# Patient Record
Sex: Male | Born: 1935 | Race: White | Hispanic: No | Marital: Married
Health system: Midwestern US, Community
[De-identification: ages and names within clinical notes are randomized; demographics above are authoritative.]

## PROBLEM LIST (undated history)

## (undated) DIAGNOSIS — R011 Cardiac murmur, unspecified: Secondary | ICD-10-CM

## (undated) DIAGNOSIS — E785 Hyperlipidemia, unspecified: Secondary | ICD-10-CM

## (undated) DIAGNOSIS — Z87898 Personal history of other specified conditions: Secondary | ICD-10-CM

## (undated) DIAGNOSIS — C61 Malignant neoplasm of prostate: Secondary | ICD-10-CM

## (undated) DIAGNOSIS — Z8659 Personal history of other mental and behavioral disorders: Secondary | ICD-10-CM

## (undated) DIAGNOSIS — G459 Transient cerebral ischemic attack, unspecified: Secondary | ICD-10-CM

## (undated) DIAGNOSIS — I639 Cerebral infarction, unspecified: Secondary | ICD-10-CM

## (undated) HISTORY — DX: Personal history of other specified conditions: Z87.898

## (undated) HISTORY — DX: Cerebral infarction, unspecified: I63.9

## (undated) HISTORY — PX: HERNIA REPAIR: SHX51

## (undated) HISTORY — DX: Transient cerebral ischemic attack, unspecified: G45.9

## (undated) HISTORY — DX: Malignant neoplasm of prostate: C61

## (undated) HISTORY — DX: Hyperlipidemia, unspecified: E78.5

## (undated) HISTORY — PX: PROSTATE BIOPSY: SHX241

---

## 1999-11-28 ENCOUNTER — Other Ambulatory Visit: Admission: RE | Admit: 1999-11-28 | Discharge: 1999-11-28 | Payer: Self-pay | Admitting: Gastroenterology

## 1999-12-12 ENCOUNTER — Encounter: Payer: Self-pay | Admitting: Gastroenterology

## 1999-12-12 ENCOUNTER — Ambulatory Visit (HOSPITAL_COMMUNITY): Admission: RE | Admit: 1999-12-12 | Discharge: 1999-12-12 | Payer: Self-pay | Admitting: Gastroenterology

## 2014-01-26 ENCOUNTER — Other Ambulatory Visit: Payer: Self-pay | Admitting: Urology

## 2014-01-26 DIAGNOSIS — C61 Malignant neoplasm of prostate: Secondary | ICD-10-CM

## 2014-01-29 ENCOUNTER — Other Ambulatory Visit: Payer: Self-pay | Admitting: Dermatology

## 2014-02-09 ENCOUNTER — Encounter (HOSPITAL_COMMUNITY)
Admission: RE | Admit: 2014-02-09 | Discharge: 2014-02-09 | Disposition: A | Discharge status: Home / Self Care | Payer: Commercial Managed Care - HMO | Source: Ambulatory Visit | Attending: Urology | Admitting: Urology

## 2014-02-09 DIAGNOSIS — C61 Malignant neoplasm of prostate: Secondary | ICD-10-CM | POA: Insufficient documentation

## 2014-02-09 MED ORDER — TECHNETIUM TC 99M MEDRONATE IV KIT
26.0000 | PACK | Freq: Once | INTRAVENOUS | Status: AC | PRN
  Administered 2014-02-09: 26 via INTRAVENOUS

## 2014-03-11 ENCOUNTER — Encounter: Payer: Self-pay | Admitting: Radiation Oncology

## 2014-03-11 NOTE — Progress Notes (Addendum)
GU Location of Tumor / Histology: Adenocarcinoma of the Prostate    If Prostate Cancer, Gleason Score is (4 + 4) and PSA is (15.1)  [facility] Heslin Presentation    Past/Anticipated interventions by urology, if any: Dr. Brock Bad -Biopsy of Prostate and suggesting Androgen Deprivation.  Past/Anticipated interventions by medical oncology, if any: None  Weight changes, if any: Lost ~ 2-3 lbs  Bowel/Bladder complaints, if any: Nocturia x 3-4, and sometimes hourly.  Denies any urgency   Nausea/Vomiting, if any: No  Pain issues, if any: No  SAFETY ISSUES:  Prior radiation? No  Pacemaker/ICD? No  Possible current pregnancy? N/A  Is the patient on methotrexate? No  Current Complaints / other details:

## 2014-03-12 ENCOUNTER — Encounter: Payer: Self-pay | Admitting: Radiation Oncology

## 2014-03-12 ENCOUNTER — Ambulatory Visit
Admission: RE | Admit: 2014-03-12 | Discharge: 2014-03-12 | Disposition: A | Discharge status: Home / Self Care | Payer: Medicare HMO | Source: Ambulatory Visit | Attending: Radiation Oncology | Admitting: Radiation Oncology

## 2014-03-12 VITALS — BP 152/74 | HR 67 | Temp 98.0°F | Ht 72.0 in | Wt 154.4 lb

## 2014-03-12 DIAGNOSIS — C61 Malignant neoplasm of prostate: Secondary | ICD-10-CM | POA: Insufficient documentation

## 2014-03-12 HISTORY — DX: Personal history of other mental and behavioral disorders: Z86.59

## 2014-03-12 HISTORY — DX: Cardiac murmur, unspecified: R01.1

## 2014-03-12 HISTORY — DX: Malignant neoplasm of prostate: C61

## 2014-03-12 NOTE — Progress Notes (Signed)
[facility] NEW PATIENT EVALUATION  Name: Jon Hughes MRN: [id number]  Date:   03/12/2014           DOB: 06/25/1935  Status: outpatient   CC: Dr. 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 male who is seen today through the courtesy of Dr. Diona Fanti for evaluation of his stage T2a high-risk adenocarcinoma prostate.  He tells me that he had multiple biopsies in the past which were all benign.  He had biopsies at the [place] and also in [facility] in 2012.  His PSA in 2011 was approximately 7-8.  His PSA a few years ago rose to 14, and most recently 15.1.  He was seen by Dr. Diona Fanti who confirmed the presence of a nodule along the right lateral base. He performed ultrasound-guided biopsies on 01/19/2014.  He was found to have Gleason score 8 (4+4) involving 70% of one core from the right lateral base and 90% of one core from right lateral mid gland.  Gland volume was approximately 32 mL.  His staging workup including a CT scan and bone scan were without evidence for metastatic disease.  He was found to have an indeterminate low-attenuation structure within the right lobe of the liver and a follow-up exam in 6 months was recommended.  He is doing reasonably well from a GU and GI standpoint.  His I PSS score is 7.   He does have some degree of erectile dysfunction and uses either Cialis or Viagra.    PREVIOUS RADIATION THERAPY: No   PAST MEDICAL HISTORY:  has a past medical history of Prostate cancer; anxiety disorder; major depression; and Cardiac murmur.     PAST SURGICAL HISTORY:  Past Surgical History  Procedure Laterality Date  . Prostate biopsy    . Hernia repair       FAMILY HISTORY: family history includes Breast cancer in his sister; Liver cancer in his mother; Multiple myeloma in  his sister; Throat cancer in his brother. His father died of suicide at 50.  His mother died from " liver cancer" at 35.  No family history of prostate cancer.   SOCIAL HISTORY:  reports that he has quit smoking. His smoking use included Cigarettes. He has a 8 pack-year smoking history. He does not have any smokeless tobacco history on file. He reports that he does not drink alcohol or use illicit drugs. Married, 2 sons from his first marriage.  Graduate of [employer].  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   ALLERGIES: Codeine and Sulfa antibiotics   MEDICATIONS:  Current Outpatient Prescriptions  Medication Sig Dispense Refill  . Multiple Vitamin (MULTIVITAMINS PO) Take 1 tablet by mouth.    Marland Kitchen PARoxetine (PAXIL) 20 MG tablet Take 20 mg by mouth daily.     No current facility-administered medications for this encounter.     REVIEW OF SYSTEMS:  Pertinent items are noted in HPI.    PHYSICAL EXAM:  height is 6' (1.829 m) and weight is 154 lb 6.4 oz (70.035 kg). His temperature is 98 F (36.7 C). His blood pressure is 152/74 and his pulse is 67.   Alert and oriented.  Head and neck examination: Grossly unremarkable.  Nodes: Without palpable cervical or supraclavicular lymphadenopathy.  Abdomen: Without hepatomegaly.  Rectal: Prostate gland is normal in size and there is a firm 1 cm nodule along the lateral right base.  No palpable periprostatic tumor extension.  Extremities: Without edema.   LABORATORY DATA:  No results found for: WBC, HGB, HCT, MCV, PLT No results found for: NA, K, CL, CO2 No results found for: ALT, AST, GGT, ALKPHOS, BILITOT    PSA 15.1   IMPRESSION: StageT2a high-risk adenocarcinoma prostate.  I explained to the patient that his prognosis related to his stage, PSA level, and Gleason score.  His stage is favorable while his PSA of 15.1 is of intermediate favorability, and his Gleason score of 8 is distinctly unfavorable.  He has  high-risk disease.  Dr. Diona Fanti explained to him that he has a 25% chance for lymph node involvement, 15% chance for seminal vesicle involvement and 75% chance for extracapsular extension.  Fortunately, his staging workup is negative.  We discussed management options, and in particular radiation therapy.  Radiation therapy options include 5 weeks of external beam followed by seed implantation or 8 weeks of external beam/IMRT.  I do favor external beam/IMRT to avoid the OR, because of the significant risk for extracapsular extension.  We discussed the potential acute and late toxicities of radiation therapy.  We also discussed bladder filling to minimize urinary toxicity.  We discussed the fact that we need 3 gold seed markers for image guidance.  We discussed androgen deprivation therapy which ideally should be given for 2 years.  We discussed the potential side effects of androgen deprivation therapy including also sex drive/erectile dysfunction, hot flashes, and fatigue.  After lengthy discussion he is most interested in external beam/IMRT along with androgen deprivation therapy which I think would be an excellent choice for him.  I will speak with Dr. Alan Ripper nurse to get him started on his androgen deprivation therapy.  We will get him scheduled for placement of gold seeds within the next one to 2 months.  Follow visit with me in 2 months.  PLAN: As discussed above.   I spent 60 minutes minutes face to face with the patient and more than 50% of that time was spent in counseling and/or coordination of care.

## 2014-03-13 ENCOUNTER — Telehealth: Payer: Self-pay | Admitting: *Deleted

## 2014-03-13 NOTE — Telephone Encounter (Signed)
Called patient to inform of [facility]  Appt. For 05/14/14, lvm for a return call

## 2014-03-13 NOTE — Telephone Encounter (Signed)
Called patient to inform of [facility] appt. For 5/

## 2014-03-16 ENCOUNTER — Encounter: Payer: Self-pay | Admitting: Radiation Oncology

## 2014-03-16 NOTE — Progress Notes (Signed)
CC: Dr. Franchot Gallo, Dr. Domenick Gong   I scheduled Jon Hughes for initiation of androgen deprivation therapy through Dr. Alan Ripper nurse as requested by the patient.  At the end of his consultation with me, he agreed to androgen deprivation therapy along with external beam/IMRT.  I am somewhat surprised that he now tells my staff that he really had not made up his mind.  My understanding is that he has been speaking with his brother and is "looking at other options" including ?cryotherapy at [facility].  Therefore, we will not move ahead with androgen deprivation therapy or placement of gold seeds. We will cancel his follow-up with me in 2 months.  He can contact me if he wants to review his options once again.  I more than happy to see him in the future.

## 2014-03-18 NOTE — Addendum Note (Signed)
Encounter addended by: Benn Moulder, RN on: 03/18/2014  1:44 PM<BR>     Documentation filed: Arn Medal VN

## 2014-05-05 ENCOUNTER — Other Ambulatory Visit: Payer: Self-pay | Admitting: Dermatology

## 2014-05-14 ENCOUNTER — Ambulatory Visit: Payer: Medicare HMO | Admitting: Radiation Oncology

## 2014-05-14 ENCOUNTER — Ambulatory Visit: Payer: Medicare HMO

## 2014-06-30 ENCOUNTER — Telehealth: Payer: Self-pay | Admitting: *Deleted

## 2014-06-30 NOTE — Telephone Encounter (Signed)
On 06-29-14 fax medical records to to justin cockerham at [facility] it was consult note.

## 2014-07-06 ENCOUNTER — Other Ambulatory Visit: Payer: Self-pay

## 2014-07-20 DIAGNOSIS — H2513 Age-related nuclear cataract, bilateral: Secondary | ICD-10-CM | POA: Diagnosis not present

## 2014-07-20 DIAGNOSIS — H25013 Cortical age-related cataract, bilateral: Secondary | ICD-10-CM | POA: Diagnosis not present

## 2014-07-20 DIAGNOSIS — H25012 Cortical age-related cataract, left eye: Secondary | ICD-10-CM | POA: Diagnosis not present

## 2014-07-20 DIAGNOSIS — H2512 Age-related nuclear cataract, left eye: Secondary | ICD-10-CM | POA: Diagnosis not present

## 2014-07-27 DIAGNOSIS — R3912 Poor urinary stream: Secondary | ICD-10-CM | POA: Diagnosis not present

## 2014-07-27 DIAGNOSIS — N402 Nodular prostate without lower urinary tract symptoms: Secondary | ICD-10-CM | POA: Diagnosis not present

## 2014-07-27 DIAGNOSIS — C61 Malignant neoplasm of prostate: Secondary | ICD-10-CM | POA: Diagnosis not present

## 2014-07-27 DIAGNOSIS — N320 Bladder-neck obstruction: Secondary | ICD-10-CM | POA: Diagnosis not present

## 2014-07-28 DIAGNOSIS — C61 Malignant neoplasm of prostate: Secondary | ICD-10-CM | POA: Diagnosis not present

## 2014-07-29 DIAGNOSIS — Z510 Encounter for antineoplastic radiation therapy: Secondary | ICD-10-CM | POA: Diagnosis not present

## 2014-07-29 DIAGNOSIS — C61 Malignant neoplasm of prostate: Secondary | ICD-10-CM | POA: Diagnosis not present

## 2014-07-30 DIAGNOSIS — C61 Malignant neoplasm of prostate: Secondary | ICD-10-CM | POA: Diagnosis not present

## 2014-08-24 DIAGNOSIS — C61 Malignant neoplasm of prostate: Secondary | ICD-10-CM | POA: Diagnosis not present

## 2014-08-25 DIAGNOSIS — Z510 Encounter for antineoplastic radiation therapy: Secondary | ICD-10-CM | POA: Diagnosis not present

## 2014-08-25 DIAGNOSIS — C61 Malignant neoplasm of prostate: Secondary | ICD-10-CM | POA: Diagnosis not present

## 2014-08-26 DIAGNOSIS — C61 Malignant neoplasm of prostate: Secondary | ICD-10-CM | POA: Diagnosis not present

## 2014-08-26 DIAGNOSIS — Z510 Encounter for antineoplastic radiation therapy: Secondary | ICD-10-CM | POA: Diagnosis not present

## 2014-08-27 DIAGNOSIS — C61 Malignant neoplasm of prostate: Secondary | ICD-10-CM | POA: Diagnosis not present

## 2014-08-27 DIAGNOSIS — Z510 Encounter for antineoplastic radiation therapy: Secondary | ICD-10-CM | POA: Diagnosis not present

## 2014-08-28 DIAGNOSIS — C61 Malignant neoplasm of prostate: Secondary | ICD-10-CM | POA: Diagnosis not present

## 2014-08-28 DIAGNOSIS — Z510 Encounter for antineoplastic radiation therapy: Secondary | ICD-10-CM | POA: Diagnosis not present

## 2014-08-31 DIAGNOSIS — C61 Malignant neoplasm of prostate: Secondary | ICD-10-CM | POA: Diagnosis not present

## 2014-08-31 DIAGNOSIS — Z510 Encounter for antineoplastic radiation therapy: Secondary | ICD-10-CM | POA: Diagnosis not present

## 2014-09-01 DIAGNOSIS — Z510 Encounter for antineoplastic radiation therapy: Secondary | ICD-10-CM | POA: Diagnosis not present

## 2014-09-01 DIAGNOSIS — C61 Malignant neoplasm of prostate: Secondary | ICD-10-CM | POA: Diagnosis not present

## 2014-09-02 DIAGNOSIS — C61 Malignant neoplasm of prostate: Secondary | ICD-10-CM | POA: Diagnosis not present

## 2014-09-02 DIAGNOSIS — Z510 Encounter for antineoplastic radiation therapy: Secondary | ICD-10-CM | POA: Diagnosis not present

## 2014-09-03 DIAGNOSIS — Z510 Encounter for antineoplastic radiation therapy: Secondary | ICD-10-CM | POA: Diagnosis not present

## 2014-09-03 DIAGNOSIS — C61 Malignant neoplasm of prostate: Secondary | ICD-10-CM | POA: Diagnosis not present

## 2014-09-04 DIAGNOSIS — C61 Malignant neoplasm of prostate: Secondary | ICD-10-CM | POA: Diagnosis not present

## 2014-09-04 DIAGNOSIS — Z510 Encounter for antineoplastic radiation therapy: Secondary | ICD-10-CM | POA: Diagnosis not present

## 2014-09-07 DIAGNOSIS — C61 Malignant neoplasm of prostate: Secondary | ICD-10-CM | POA: Diagnosis not present

## 2014-09-07 DIAGNOSIS — Z510 Encounter for antineoplastic radiation therapy: Secondary | ICD-10-CM | POA: Diagnosis not present

## 2014-09-08 DIAGNOSIS — Z510 Encounter for antineoplastic radiation therapy: Secondary | ICD-10-CM | POA: Diagnosis not present

## 2014-09-08 DIAGNOSIS — C61 Malignant neoplasm of prostate: Secondary | ICD-10-CM | POA: Diagnosis not present

## 2014-09-09 DIAGNOSIS — Z510 Encounter for antineoplastic radiation therapy: Secondary | ICD-10-CM | POA: Diagnosis not present

## 2014-09-09 DIAGNOSIS — C61 Malignant neoplasm of prostate: Secondary | ICD-10-CM | POA: Diagnosis not present

## 2014-09-10 DIAGNOSIS — Z510 Encounter for antineoplastic radiation therapy: Secondary | ICD-10-CM | POA: Diagnosis not present

## 2014-09-10 DIAGNOSIS — C61 Malignant neoplasm of prostate: Secondary | ICD-10-CM | POA: Diagnosis not present

## 2014-09-11 DIAGNOSIS — Z510 Encounter for antineoplastic radiation therapy: Secondary | ICD-10-CM | POA: Diagnosis not present

## 2014-09-11 DIAGNOSIS — C61 Malignant neoplasm of prostate: Secondary | ICD-10-CM | POA: Diagnosis not present

## 2014-09-15 DIAGNOSIS — C61 Malignant neoplasm of prostate: Secondary | ICD-10-CM | POA: Diagnosis not present

## 2014-09-15 DIAGNOSIS — Z510 Encounter for antineoplastic radiation therapy: Secondary | ICD-10-CM | POA: Diagnosis not present

## 2014-09-16 DIAGNOSIS — Z510 Encounter for antineoplastic radiation therapy: Secondary | ICD-10-CM | POA: Diagnosis not present

## 2014-09-16 DIAGNOSIS — C61 Malignant neoplasm of prostate: Secondary | ICD-10-CM | POA: Diagnosis not present

## 2014-09-17 DIAGNOSIS — Z510 Encounter for antineoplastic radiation therapy: Secondary | ICD-10-CM | POA: Diagnosis not present

## 2014-09-17 DIAGNOSIS — C61 Malignant neoplasm of prostate: Secondary | ICD-10-CM | POA: Diagnosis not present

## 2014-09-18 DIAGNOSIS — Z510 Encounter for antineoplastic radiation therapy: Secondary | ICD-10-CM | POA: Diagnosis not present

## 2014-09-18 DIAGNOSIS — C61 Malignant neoplasm of prostate: Secondary | ICD-10-CM | POA: Diagnosis not present

## 2014-09-21 DIAGNOSIS — Z510 Encounter for antineoplastic radiation therapy: Secondary | ICD-10-CM | POA: Diagnosis not present

## 2014-09-21 DIAGNOSIS — C61 Malignant neoplasm of prostate: Secondary | ICD-10-CM | POA: Diagnosis not present

## 2014-09-22 DIAGNOSIS — Z510 Encounter for antineoplastic radiation therapy: Secondary | ICD-10-CM | POA: Diagnosis not present

## 2014-09-22 DIAGNOSIS — C61 Malignant neoplasm of prostate: Secondary | ICD-10-CM | POA: Diagnosis not present

## 2014-09-23 DIAGNOSIS — Z510 Encounter for antineoplastic radiation therapy: Secondary | ICD-10-CM | POA: Diagnosis not present

## 2014-09-23 DIAGNOSIS — C61 Malignant neoplasm of prostate: Secondary | ICD-10-CM | POA: Diagnosis not present

## 2014-09-24 DIAGNOSIS — C61 Malignant neoplasm of prostate: Secondary | ICD-10-CM | POA: Diagnosis not present

## 2014-09-24 DIAGNOSIS — Z510 Encounter for antineoplastic radiation therapy: Secondary | ICD-10-CM | POA: Diagnosis not present

## 2014-09-25 DIAGNOSIS — C61 Malignant neoplasm of prostate: Secondary | ICD-10-CM | POA: Diagnosis not present

## 2014-09-25 DIAGNOSIS — Z510 Encounter for antineoplastic radiation therapy: Secondary | ICD-10-CM | POA: Diagnosis not present

## 2014-09-28 DIAGNOSIS — C61 Malignant neoplasm of prostate: Secondary | ICD-10-CM | POA: Diagnosis not present

## 2014-09-28 DIAGNOSIS — Z510 Encounter for antineoplastic radiation therapy: Secondary | ICD-10-CM | POA: Diagnosis not present

## 2014-09-29 DIAGNOSIS — C61 Malignant neoplasm of prostate: Secondary | ICD-10-CM | POA: Diagnosis not present

## 2014-09-29 DIAGNOSIS — Z510 Encounter for antineoplastic radiation therapy: Secondary | ICD-10-CM | POA: Diagnosis not present

## 2014-09-30 DIAGNOSIS — Z510 Encounter for antineoplastic radiation therapy: Secondary | ICD-10-CM | POA: Diagnosis not present

## 2014-09-30 DIAGNOSIS — C61 Malignant neoplasm of prostate: Secondary | ICD-10-CM | POA: Diagnosis not present

## 2014-10-01 DIAGNOSIS — Z510 Encounter for antineoplastic radiation therapy: Secondary | ICD-10-CM | POA: Diagnosis not present

## 2014-10-01 DIAGNOSIS — C61 Malignant neoplasm of prostate: Secondary | ICD-10-CM | POA: Diagnosis not present

## 2014-10-02 DIAGNOSIS — C61 Malignant neoplasm of prostate: Secondary | ICD-10-CM | POA: Diagnosis not present

## 2014-10-02 DIAGNOSIS — Z510 Encounter for antineoplastic radiation therapy: Secondary | ICD-10-CM | POA: Diagnosis not present

## 2014-10-05 DIAGNOSIS — Z510 Encounter for antineoplastic radiation therapy: Secondary | ICD-10-CM | POA: Diagnosis not present

## 2014-10-05 DIAGNOSIS — C61 Malignant neoplasm of prostate: Secondary | ICD-10-CM | POA: Diagnosis not present

## 2014-10-06 DIAGNOSIS — C61 Malignant neoplasm of prostate: Secondary | ICD-10-CM | POA: Diagnosis not present

## 2014-10-06 DIAGNOSIS — Z510 Encounter for antineoplastic radiation therapy: Secondary | ICD-10-CM | POA: Diagnosis not present

## 2014-10-07 DIAGNOSIS — Z510 Encounter for antineoplastic radiation therapy: Secondary | ICD-10-CM | POA: Diagnosis not present

## 2014-10-07 DIAGNOSIS — C61 Malignant neoplasm of prostate: Secondary | ICD-10-CM | POA: Diagnosis not present

## 2014-10-08 DIAGNOSIS — Z510 Encounter for antineoplastic radiation therapy: Secondary | ICD-10-CM | POA: Diagnosis not present

## 2014-10-08 DIAGNOSIS — C61 Malignant neoplasm of prostate: Secondary | ICD-10-CM | POA: Diagnosis not present

## 2014-10-09 DIAGNOSIS — C61 Malignant neoplasm of prostate: Secondary | ICD-10-CM | POA: Diagnosis not present

## 2014-10-09 DIAGNOSIS — Z510 Encounter for antineoplastic radiation therapy: Secondary | ICD-10-CM | POA: Diagnosis not present

## 2014-10-12 DIAGNOSIS — Z510 Encounter for antineoplastic radiation therapy: Secondary | ICD-10-CM | POA: Diagnosis not present

## 2014-10-12 DIAGNOSIS — C61 Malignant neoplasm of prostate: Secondary | ICD-10-CM | POA: Diagnosis not present

## 2014-10-13 DIAGNOSIS — Z510 Encounter for antineoplastic radiation therapy: Secondary | ICD-10-CM | POA: Diagnosis not present

## 2014-10-13 DIAGNOSIS — C61 Malignant neoplasm of prostate: Secondary | ICD-10-CM | POA: Diagnosis not present

## 2014-10-16 DIAGNOSIS — H9211 Otorrhea, right ear: Secondary | ICD-10-CM | POA: Diagnosis not present

## 2014-10-16 DIAGNOSIS — H7291 Unspecified perforation of tympanic membrane, right ear: Secondary | ICD-10-CM | POA: Diagnosis not present

## 2014-10-16 DIAGNOSIS — H9011 Conductive hearing loss, unilateral, right ear, with unrestricted hearing on the contralateral side: Secondary | ICD-10-CM | POA: Diagnosis not present

## 2014-10-19 DIAGNOSIS — F331 Major depressive disorder, recurrent, moderate: Secondary | ICD-10-CM | POA: Diagnosis not present

## 2014-11-03 DIAGNOSIS — H900 Conductive hearing loss, bilateral: Secondary | ICD-10-CM | POA: Diagnosis not present

## 2014-11-20 DIAGNOSIS — L820 Inflamed seborrheic keratosis: Secondary | ICD-10-CM | POA: Diagnosis not present

## 2014-11-20 DIAGNOSIS — L821 Other seborrheic keratosis: Secondary | ICD-10-CM | POA: Diagnosis not present

## 2014-11-20 DIAGNOSIS — D1801 Hemangioma of skin and subcutaneous tissue: Secondary | ICD-10-CM | POA: Diagnosis not present

## 2014-11-20 DIAGNOSIS — Z85828 Personal history of other malignant neoplasm of skin: Secondary | ICD-10-CM | POA: Diagnosis not present

## 2014-11-20 DIAGNOSIS — L218 Other seborrheic dermatitis: Secondary | ICD-10-CM | POA: Diagnosis not present

## 2014-11-20 DIAGNOSIS — L570 Actinic keratosis: Secondary | ICD-10-CM | POA: Diagnosis not present

## 2015-02-02 ENCOUNTER — Telehealth: Payer: Self-pay

## 2015-02-02 NOTE — Telephone Encounter (Signed)
Family request a courtesy call back from Dr. Joseph Art.  [phone]

## 2015-02-08 NOTE — Telephone Encounter (Signed)
What is this about?   I don't believe this man is our patient.

## 2015-02-09 NOTE — Telephone Encounter (Signed)
Delman Cheadle or Reginia Forts or Janeann Forehand.

## 2015-02-09 NOTE — Telephone Encounter (Signed)
Dr. Carlean Jews  I spoke with the Pt. He has not seen you,  him and his wife know you from and I will quote him the temple. He says his wife Jenny Reichmann knows your wife, and he was looking to establish care with you. I expressed to him that at this time you were not accepting new patients and he said he understood and he wanted your opinion on who he should establish care with.

## 2015-02-09 NOTE — Telephone Encounter (Signed)
Pt.notified

## 2015-06-28 DIAGNOSIS — F331 Major depressive disorder, recurrent, moderate: Secondary | ICD-10-CM | POA: Diagnosis not present

## 2015-08-25 DIAGNOSIS — H43812 Vitreous degeneration, left eye: Secondary | ICD-10-CM | POA: Diagnosis not present

## 2015-08-25 DIAGNOSIS — H2513 Age-related nuclear cataract, bilateral: Secondary | ICD-10-CM | POA: Diagnosis not present

## 2015-08-25 DIAGNOSIS — H35372 Puckering of macula, left eye: Secondary | ICD-10-CM | POA: Diagnosis not present

## 2015-08-25 DIAGNOSIS — H25013 Cortical age-related cataract, bilateral: Secondary | ICD-10-CM | POA: Diagnosis not present

## 2015-08-30 DIAGNOSIS — H2513 Age-related nuclear cataract, bilateral: Secondary | ICD-10-CM | POA: Diagnosis not present

## 2015-08-30 DIAGNOSIS — H43812 Vitreous degeneration, left eye: Secondary | ICD-10-CM | POA: Diagnosis not present

## 2015-08-30 DIAGNOSIS — H2512 Age-related nuclear cataract, left eye: Secondary | ICD-10-CM | POA: Diagnosis not present

## 2015-08-30 DIAGNOSIS — H2511 Age-related nuclear cataract, right eye: Secondary | ICD-10-CM | POA: Diagnosis not present

## 2015-08-30 NOTE — Op Note (Addendum)
[facility]    [place], [place]                 Report Type: DAY SURGERY PROCEDURE             Report Status: Signed        ___________________________________________________________________                                          PATIENT NAMCecilie Kicks: Brandt,[name] PATIENT ID# [id number]    BIRTHDATE:     [birth year]    ACCOUNT # [id number]    AGE:     81    REG DATE:  08/30/15    GENDER:     [name]    ADM DATE:      LOC:     DS    DISCH:         08/30/15    ___________________________________________________________________    Lysle DingwallINTERESTED PARTIES: NURSING UNIT:  DS          ATTENDING PHYSICIAN: Brand MalesWHITAKER,Taliana Mersereau D MD            ORDERING PHYSICIAN:               ADDITIONAL COPIES:       ___________________________________________________________________    DictatorBrand Males:   Jagjit Riner D MD        PREOPERATIVE DIAGNOSIS:     Senile cataract, left eye.          POSTOPERATIVE DIAGNOSIS:     Senile cataract, left eye.          PROCEDURE:     Phacoemulsification with posterior chamber intraocular lens, left eye.          ANESTHESIA:     Topical MAC.          BLOOD LOSS:     None.          INDICATIONS:     The patient is a 80 year old male with gradually worsening vision consistent with      senile cataract. The risks, benefits, and alternatives of cataract extraction were      discussed in detail including loss of vision, failing its intended effect, need for a      reoperation, a distorted pupil, macular edema and endophthalmitis. Questions were      answered. The patient understood and wished to proceed.          DESCRIPTION OF PROCEDURE:     The patient was escorted to the operating suite whereupon the operative left eye was      confirmed prior to induction of the sedation by the anesthesiologist. The patient      was prepped and draped in the usual sterile ophthalmic fashion. The operating      microscope was positioned over the left eye and a speculum introduced. The       paracentesis port was made and a clear corneal incision made with a keratome. The      anterior chamber was stabilized with viscoelastic and a continuous curvilinear      capsulorrhexis was started with a cystitome and completed with Utrata forceps.      Adequate hydrodissection was achieved with preservative free lidocaine. The nucleus      was chopped in the horizontal meridian with the chopper in the surgeon's nondominant      hand, the nucleus which aspirated in the iris  plan with the phacoemulsification hand      piece. Remaining cortical material was stripped with the I and A cannula, and the      posterior capsule polished as needed. The capsular bag was reformed with      viscoelastic and a 19.5 diopter SN60WF lens, serial #[id number], placed in the      capsular bag with the Monarch injector. Viscoelastic was removed with the I and A      cannula. The wound was hydrated and the anterior chamber reformed with balanced salt      solution. The pupil was round and there was no wound leak at the end of the case.                The CDE was 1.15.           The patient was discharged to recovery in good condition.               DD: 08/30/2015 11:26:59     DT: 08/30/2015 11:48:43     JOB#: [id number]     MDW/MODL              Electronically Signed by:  Garth SchlatterWHITAKER,Eustace Hur D MD at  10/11/15 1152        Electronically Co-Signed by:  at          Disclaimer: Portions of this document may contain text elements generated through computerized   voice recognition. Undetected computer generated word errors are possible. Please notify the   signing provider or return the document to [facility] department if clarification or   correction is needed.

## 2015-08-30 NOTE — Op Note (Addendum)
[facility]    [place], [place]                 Report Type: DAY SURGERY PROCEDURE             Report Status: Signed        ___________________________________________________________________                                          PATIENT NAMCecilie Kicks: Marcell,Henrik P PATIENT ID# [id number]    BIRTHDATE:     Feb 19, 1935    ACCOUNT # [id number]    AGE:     81    REG DATE:  08/30/15    GENDER:     [name]    ADM DATE:      LOC:     DS    DISCH:         08/30/15    ___________________________________________________________________    Lysle DingwallINTERESTED PARTIES: NURSING UNIT:  DS          ATTENDING PHYSICIAN: Brand MalesWHITAKER,Diago Haik D MD            ORDERING PHYSICIAN:               ADDITIONAL COPIES:       ___________________________________________________________________    DictatorBrand Males:   Darshana Curnutt D MD        PREOPERATIVE DIAGNOSIS:    Senile cataract, right eye.        POSTOPERATIVE DIAGNOSIS:    Senile cataract, right eye.        PROCEDURE:    Phacoemulsification with posterior chamber intraocular lens, right eye.         ANESTHESIA:    Topical MAC.        BLOOD LOSS:    None.        INDICATIONS:    The patient is a 80 year old male with gradually worsening vision consistent with     senile cataract. The risks, benefits, and alternatives of cataract extraction were     discussed in detail including loss of vision, failing its intended effect, need for a     reoperation, a distorted pupil, macular edema and endophthalmitis. Questions were     answered. The patient understood and wished to proceed.        DESCRIPTION OF PROCEDURE:    The patient was escorted to the operating suite whereupon the operative right eye was     confirmed prior to induction of the sedation by the anesthesiologist. The patient     was prepped and draped in the usual sterile ophthalmic fashion. The operating     microscope was positioned over the right eye and a speculum introduced. The      paracentesis port was made and a clear corneal incision made with a keratome. The     anterior chamber was stabilized with viscoelastic and a continuous curvilinear     capsulorrhexis was started with a cystitome and completed with Utrata forceps.     Adequate hydrodissection was achieved with preservative free lidocaine. The nucleus     was chopped in the horizontal meridian with the chopper in the     surgeon's nondominant hand, the nucleus which aspirated in the iris plan with the     phacoemulsification hand piece. Remaining cortical material was stripped with the I     and A cannula, and the posterior capsule polished as  needed. The capsular bag was     reformed with viscoelastic and a 18.5 diopter with 3 diopters add Alcon [id number] ReSTOR multifocal   lens, serial     #[id number], placed in the capsular bag with the     Monarch injector. Viscoelastic was removed with the I and A cannula. The wound was     hydrated and the anterior chamber reformed with balanced salt solution. The pupil     was round and there was no wound leak at the end of the case.         The CDE was 1.28.         The patient was discharged to recovery in good condition.            DD: 08/30/2015 11:28:03    DT: 08/30/2015 11:52:04    JOB#: [id number]    MDW/MODL            Electronically Signed by:  Garth SchlatterWHITAKER,Tesslyn Baumert D MD at  10/11/15 1225        Electronically Co-Signed by:  at          Disclaimer: Portions of this document may contain text elements generated through computerized   voice recognition. Undetected computer generated word errors are possible. Please notify the   signing provider or return the document to [facility] Records department if clarification or   correction is needed.

## 2015-08-30 NOTE — Op Note (Signed)
[facility]    [place], [place]                 Report Type: DAY SURGERY PROCEDURE             Report Status: Signed        ___________________________________________________________________                                          PATIENT NAMCecilie Brandt: Rampersad,Sanjeev P PATIENT ID# [id number]    BIRTHDATE:     Jul 04, 1935    ACCOUNT # [id number]    AGE:     81    REG DATE:  08/30/15    GENDER:     [name]    ADM DATE:      LOC:     DS    DISCH:         08/30/15    ___________________________________________________________________    Lysle DingwallINTERESTED PARTIES: NURSING UNIT:  DS          ATTENDING PHYSICIAN: Brand MalesWHITAKER,Ajahni Nay D MD            ORDERING PHYSICIAN:               ADDITIONAL COPIES:       ___________________________________________________________________    DictatorBrand Males:   Aarin Sparkman D MD        PREOPERATIVE DIAGNOSIS:    Senile cataract, right eye.        POSTOPERATIVE DIAGNOSIS:    Senile cataract, right eye.        PROCEDURE:    Phacoemulsification with posterior chamber intraocular lens, right eye.         ANESTHESIA:    Topical MAC.        BLOOD LOSS:    None.        INDICATIONS:    The patient is a 80 year old male with gradually worsening vision consistent with     senile cataract. The risks, benefits, and alternatives of cataract extraction were     discussed in detail including loss of vision, failing its intended effect, need for a     reoperation, a distorted pupil, macular edema and endophthalmitis. Questions were     answered. The patient understood and wished to proceed.        DESCRIPTION OF PROCEDURE:    The patient was escorted to the operating suite whereupon the operative right eye was     confirmed prior to induction of the sedation by the anesthesiologist. The patient     was prepped and draped in the usual sterile ophthalmic fashion. The operating     microscope was positioned over the right eye and a speculum introduced. The     paracentesis port was made and a clear corneal  incision made with a keratome. The     anterior chamber was stabilized with viscoelastic and a continuous curvilinear     capsulorrhexis was started with a cystitome and completed with Utrata forceps.     Adequate hydrodissection was achieved with preservative free lidocaine. The nucleus     was chopped in the horizontal meridian with the chopper in the     surgeon's nondominant hand, the nucleus which aspirated in the iris plan with the     phacoemulsification hand piece. Remaining cortical material was stripped with the I     and A cannula, and the posterior capsule polished as  needed. The capsular bag was     reformed with viscoelastic and a 18.5 diopter with 3 diopters add Alcon [id number] ReSTOR multifocal   lens, serial     #[id number], placed in the capsular bag with the     Monarch injector. Viscoelastic was removed with the I and A cannula. The wound was     hydrated and the anterior chamber reformed with balanced salt solution. The pupil     was round and there was no wound leak at the end of the case.         The CDE was 1.28.         The patient was discharged to recovery in good condition.            DD: 08/30/2015 11:28:03    DT: 08/30/2015 11:52:04    JOB#: [id number]    MDW/MODL            Electronically Signed by:  Garth SchlatterWHITAKER,Tesslyn Baumert D MD at  10/11/15 1225        Electronically Co-Signed by:  at          Disclaimer: Portions of this document may contain text elements generated through computerized   voice recognition. Undetected computer generated word errors are possible. Please notify the   signing provider or return the document to [facility] Records department if clarification or   correction is needed.

## 2015-08-30 NOTE — Op Note (Signed)
[facility]    [place], [place]                 Report Type: DAY SURGERY PROCEDURE             Report Status: Signed        ___________________________________________________________________                                          PATIENT NAMCecilie Patrick Brandt,Patrick Brandt PATIENT ID# [id number]    BIRTHDATE:     [birth year]    ACCOUNT # [id number]    AGE:     81    REG DATE:  08/30/15    GENDER:     [name]    ADM DATE:      LOC:     DS    DISCH:         08/30/15    ___________________________________________________________________    Lysle DingwallINTERESTED PARTIES: NURSING UNIT:  DS          ATTENDING PHYSICIAN: Brand MalesWHITAKER,Alex Leahy D MD            ORDERING PHYSICIAN:               ADDITIONAL COPIES:       ___________________________________________________________________    DictatorBrand Males:   Jayshun Galentine D MD        PREOPERATIVE DIAGNOSIS:     Senile cataract, left eye.          POSTOPERATIVE DIAGNOSIS:     Senile cataract, left eye.          PROCEDURE:     Phacoemulsification with posterior chamber intraocular lens, left eye.          ANESTHESIA:     Topical MAC.          BLOOD LOSS:     None.          INDICATIONS:     The patient is a 80 year old male with gradually worsening vision consistent with      senile cataract. The risks, benefits, and alternatives of cataract extraction were      discussed in detail including loss of vision, failing its intended effect, need for a      reoperation, a distorted pupil, macular edema and endophthalmitis. Questions were      answered. The patient understood and wished to proceed.          DESCRIPTION OF PROCEDURE:     The patient was escorted to the operating suite whereupon the operative left eye was      confirmed prior to induction of the sedation by the anesthesiologist. The patient      was prepped and draped in the usual sterile ophthalmic fashion. The operating      microscope was positioned over the left eye and a speculum introduced. The      paracentesis port was made and a  clear corneal incision made with a keratome. The      anterior chamber was stabilized with viscoelastic and a continuous curvilinear      capsulorrhexis was started with a cystitome and completed with Utrata forceps.      Adequate hydrodissection was achieved with preservative free lidocaine. The nucleus      was chopped in the horizontal meridian with the chopper in the surgeon's nondominant      hand, the nucleus which aspirated in the iris  plan with the phacoemulsification hand      piece. Remaining cortical material was stripped with the I and A cannula, and the      posterior capsule polished as needed. The capsular bag was reformed with      viscoelastic and a 19.5 diopter SN60WF lens, serial #[id number], placed in the      capsular bag with the Monarch injector. Viscoelastic was removed with the I and A      cannula. The wound was hydrated and the anterior chamber reformed with balanced salt      solution. The pupil was round and there was no wound leak at the end of the case.                The CDE was 1.15.           The patient was discharged to recovery in good condition.               DD: 08/30/2015 11:26:59     DT: 08/30/2015 11:48:43     JOB#: [id number]     MDW/MODL              Electronically Signed by:  Garth SchlatterWHITAKER,Aramis Zobel D MD at  10/11/15 1152        Electronically Co-Signed by:  at          Disclaimer: Portions of this document may contain text elements generated through computerized   voice recognition. Undetected computer generated word errors are possible. Please notify the   signing provider or return the document to [facility] department if clarification or   correction is needed.

## 2015-11-04 DIAGNOSIS — M542 Cervicalgia: Secondary | ICD-10-CM | POA: Diagnosis not present

## 2015-11-08 DIAGNOSIS — M542 Cervicalgia: Secondary | ICD-10-CM | POA: Diagnosis not present

## 2015-11-10 DIAGNOSIS — M542 Cervicalgia: Secondary | ICD-10-CM | POA: Diagnosis not present

## 2015-11-15 DIAGNOSIS — M542 Cervicalgia: Secondary | ICD-10-CM | POA: Diagnosis not present

## 2015-11-17 DIAGNOSIS — M542 Cervicalgia: Secondary | ICD-10-CM | POA: Diagnosis not present

## 2015-11-23 DIAGNOSIS — L218 Other seborrheic dermatitis: Secondary | ICD-10-CM | POA: Diagnosis not present

## 2015-11-23 DIAGNOSIS — Z85828 Personal history of other malignant neoplasm of skin: Secondary | ICD-10-CM | POA: Diagnosis not present

## 2015-11-23 DIAGNOSIS — L821 Other seborrheic keratosis: Secondary | ICD-10-CM | POA: Diagnosis not present

## 2015-11-23 DIAGNOSIS — L814 Other melanin hyperpigmentation: Secondary | ICD-10-CM | POA: Diagnosis not present

## 2015-11-23 DIAGNOSIS — C44722 Squamous cell carcinoma of skin of right lower limb, including hip: Secondary | ICD-10-CM | POA: Diagnosis not present

## 2015-11-23 DIAGNOSIS — D1801 Hemangioma of skin and subcutaneous tissue: Secondary | ICD-10-CM | POA: Diagnosis not present

## 2015-11-23 DIAGNOSIS — D485 Neoplasm of uncertain behavior of skin: Secondary | ICD-10-CM | POA: Diagnosis not present

## 2015-11-23 DIAGNOSIS — L570 Actinic keratosis: Secondary | ICD-10-CM | POA: Diagnosis not present

## 2015-11-24 DIAGNOSIS — M542 Cervicalgia: Secondary | ICD-10-CM | POA: Diagnosis not present

## 2015-12-01 DIAGNOSIS — M542 Cervicalgia: Secondary | ICD-10-CM | POA: Diagnosis not present

## 2015-12-07 DIAGNOSIS — C44722 Squamous cell carcinoma of skin of right lower limb, including hip: Secondary | ICD-10-CM | POA: Diagnosis not present

## 2015-12-07 DIAGNOSIS — Z85828 Personal history of other malignant neoplasm of skin: Secondary | ICD-10-CM | POA: Diagnosis not present

## 2015-12-08 DIAGNOSIS — M542 Cervicalgia: Secondary | ICD-10-CM | POA: Diagnosis not present

## 2015-12-15 DIAGNOSIS — M542 Cervicalgia: Secondary | ICD-10-CM | POA: Diagnosis not present

## 2015-12-22 DIAGNOSIS — M542 Cervicalgia: Secondary | ICD-10-CM | POA: Diagnosis not present

## 2015-12-27 DIAGNOSIS — C61 Malignant neoplasm of prostate: Secondary | ICD-10-CM | POA: Diagnosis not present

## 2015-12-27 DIAGNOSIS — R159 Full incontinence of feces: Secondary | ICD-10-CM | POA: Diagnosis not present

## 2015-12-27 DIAGNOSIS — E785 Hyperlipidemia, unspecified: Secondary | ICD-10-CM | POA: Diagnosis not present

## 2015-12-29 DIAGNOSIS — M542 Cervicalgia: Secondary | ICD-10-CM | POA: Diagnosis not present

## 2016-01-25 DIAGNOSIS — L218 Other seborrheic dermatitis: Secondary | ICD-10-CM | POA: Diagnosis not present

## 2016-01-25 DIAGNOSIS — L570 Actinic keratosis: Secondary | ICD-10-CM | POA: Diagnosis not present

## 2016-01-25 DIAGNOSIS — Z85828 Personal history of other malignant neoplasm of skin: Secondary | ICD-10-CM | POA: Diagnosis not present

## 2016-01-25 DIAGNOSIS — Z8546 Personal history of malignant neoplasm of prostate: Secondary | ICD-10-CM | POA: Diagnosis not present

## 2016-02-24 DIAGNOSIS — Z85828 Personal history of other malignant neoplasm of skin: Secondary | ICD-10-CM | POA: Diagnosis not present

## 2016-02-24 DIAGNOSIS — L218 Other seborrheic dermatitis: Secondary | ICD-10-CM | POA: Diagnosis not present

## 2016-02-24 DIAGNOSIS — C44729 Squamous cell carcinoma of skin of left lower limb, including hip: Secondary | ICD-10-CM | POA: Diagnosis not present

## 2016-03-06 DIAGNOSIS — F331 Major depressive disorder, recurrent, moderate: Secondary | ICD-10-CM | POA: Diagnosis not present

## 2016-03-27 DIAGNOSIS — R159 Full incontinence of feces: Secondary | ICD-10-CM | POA: Diagnosis not present

## 2016-03-27 DIAGNOSIS — C61 Malignant neoplasm of prostate: Secondary | ICD-10-CM | POA: Diagnosis not present

## 2016-03-27 DIAGNOSIS — G479 Sleep disorder, unspecified: Secondary | ICD-10-CM | POA: Diagnosis not present

## 2016-03-27 DIAGNOSIS — Z1389 Encounter for screening for other disorder: Secondary | ICD-10-CM | POA: Diagnosis not present

## 2016-03-27 DIAGNOSIS — Z0001 Encounter for general adult medical examination with abnormal findings: Secondary | ICD-10-CM | POA: Diagnosis not present

## 2016-03-27 DIAGNOSIS — E559 Vitamin D deficiency, unspecified: Secondary | ICD-10-CM | POA: Diagnosis not present

## 2016-03-27 DIAGNOSIS — F419 Anxiety disorder, unspecified: Secondary | ICD-10-CM | POA: Diagnosis not present

## 2016-03-27 DIAGNOSIS — E785 Hyperlipidemia, unspecified: Secondary | ICD-10-CM | POA: Diagnosis not present

## 2016-03-27 DIAGNOSIS — Z79899 Other long term (current) drug therapy: Secondary | ICD-10-CM | POA: Diagnosis not present

## 2016-04-18 DIAGNOSIS — Z8546 Personal history of malignant neoplasm of prostate: Secondary | ICD-10-CM | POA: Diagnosis not present

## 2016-04-25 DIAGNOSIS — Z8546 Personal history of malignant neoplasm of prostate: Secondary | ICD-10-CM | POA: Diagnosis not present

## 2016-07-27 DIAGNOSIS — Z8546 Personal history of malignant neoplasm of prostate: Secondary | ICD-10-CM | POA: Diagnosis not present

## 2016-10-03 DIAGNOSIS — S43102A Unspecified dislocation of left acromioclavicular joint, initial encounter: Secondary | ICD-10-CM | POA: Diagnosis not present

## 2016-10-17 DIAGNOSIS — S43102D Unspecified dislocation of left acromioclavicular joint, subsequent encounter: Secondary | ICD-10-CM | POA: Diagnosis not present

## 2016-10-24 DIAGNOSIS — Z8546 Personal history of malignant neoplasm of prostate: Secondary | ICD-10-CM | POA: Diagnosis not present

## 2016-10-31 DIAGNOSIS — Z8546 Personal history of malignant neoplasm of prostate: Secondary | ICD-10-CM | POA: Diagnosis not present

## 2016-10-31 DIAGNOSIS — R351 Nocturia: Secondary | ICD-10-CM | POA: Diagnosis not present

## 2016-10-31 DIAGNOSIS — N5201 Erectile dysfunction due to arterial insufficiency: Secondary | ICD-10-CM | POA: Diagnosis not present

## 2016-11-04 DIAGNOSIS — Z23 Encounter for immunization: Secondary | ICD-10-CM | POA: Diagnosis not present

## 2017-01-30 DIAGNOSIS — L218 Other seborrheic dermatitis: Secondary | ICD-10-CM | POA: Diagnosis not present

## 2017-01-30 DIAGNOSIS — D485 Neoplasm of uncertain behavior of skin: Secondary | ICD-10-CM | POA: Diagnosis not present

## 2017-01-30 DIAGNOSIS — L814 Other melanin hyperpigmentation: Secondary | ICD-10-CM | POA: Diagnosis not present

## 2017-01-30 DIAGNOSIS — D1801 Hemangioma of skin and subcutaneous tissue: Secondary | ICD-10-CM | POA: Diagnosis not present

## 2017-01-30 DIAGNOSIS — Z85828 Personal history of other malignant neoplasm of skin: Secondary | ICD-10-CM | POA: Diagnosis not present

## 2017-01-30 DIAGNOSIS — D692 Other nonthrombocytopenic purpura: Secondary | ICD-10-CM | POA: Diagnosis not present

## 2017-01-30 DIAGNOSIS — L821 Other seborrheic keratosis: Secondary | ICD-10-CM | POA: Diagnosis not present

## 2017-01-30 DIAGNOSIS — L578 Other skin changes due to chronic exposure to nonionizing radiation: Secondary | ICD-10-CM | POA: Diagnosis not present

## 2017-01-30 DIAGNOSIS — L720 Epidermal cyst: Secondary | ICD-10-CM | POA: Diagnosis not present

## 2017-01-30 DIAGNOSIS — C44719 Basal cell carcinoma of skin of left lower limb, including hip: Secondary | ICD-10-CM | POA: Diagnosis not present

## 2017-02-06 DIAGNOSIS — C44719 Basal cell carcinoma of skin of left lower limb, including hip: Secondary | ICD-10-CM | POA: Diagnosis not present

## 2017-02-06 DIAGNOSIS — Z85828 Personal history of other malignant neoplasm of skin: Secondary | ICD-10-CM | POA: Diagnosis not present

## 2017-03-20 DIAGNOSIS — F331 Major depressive disorder, recurrent, moderate: Secondary | ICD-10-CM | POA: Diagnosis not present

## 2017-04-05 ENCOUNTER — Encounter (HOSPITAL_COMMUNITY): Payer: Self-pay | Admitting: Emergency Medicine

## 2017-04-05 ENCOUNTER — Observation Stay (HOSPITAL_COMMUNITY): Payer: Medicare Other

## 2017-04-05 ENCOUNTER — Observation Stay (HOSPITAL_COMMUNITY)
Admission: EM | Admit: 2017-04-05 | Discharge: 2017-04-06 | Disposition: A | Discharge status: Home / Self Care | Payer: Medicare Other | Attending: Family Medicine | Admitting: Family Medicine

## 2017-04-05 ENCOUNTER — Emergency Department (HOSPITAL_COMMUNITY): Payer: Medicare Other

## 2017-04-05 DIAGNOSIS — G459 Transient cerebral ischemic attack, unspecified: Secondary | ICD-10-CM

## 2017-04-05 DIAGNOSIS — D075 Carcinoma in situ of prostate: Secondary | ICD-10-CM | POA: Diagnosis not present

## 2017-04-05 DIAGNOSIS — Z0001 Encounter for general adult medical examination with abnormal findings: Secondary | ICD-10-CM | POA: Diagnosis not present

## 2017-04-05 DIAGNOSIS — E785 Hyperlipidemia, unspecified: Secondary | ICD-10-CM

## 2017-04-05 DIAGNOSIS — Z23 Encounter for immunization: Secondary | ICD-10-CM | POA: Diagnosis not present

## 2017-04-05 DIAGNOSIS — C61 Malignant neoplasm of prostate: Secondary | ICD-10-CM | POA: Diagnosis not present

## 2017-04-05 DIAGNOSIS — R4182 Altered mental status, unspecified: Secondary | ICD-10-CM | POA: Diagnosis present

## 2017-04-05 DIAGNOSIS — I6789 Other cerebrovascular disease: Secondary | ICD-10-CM | POA: Diagnosis not present

## 2017-04-05 DIAGNOSIS — Z87891 Personal history of nicotine dependence: Secondary | ICD-10-CM | POA: Insufficient documentation

## 2017-04-05 DIAGNOSIS — E559 Vitamin D deficiency, unspecified: Secondary | ICD-10-CM | POA: Diagnosis not present

## 2017-04-05 DIAGNOSIS — R410 Disorientation, unspecified: Secondary | ICD-10-CM | POA: Diagnosis not present

## 2017-04-05 DIAGNOSIS — Z87898 Personal history of other specified conditions: Secondary | ICD-10-CM

## 2017-04-05 DIAGNOSIS — Z8739 Personal history of other diseases of the musculoskeletal system and connective tissue: Secondary | ICD-10-CM | POA: Diagnosis not present

## 2017-04-05 DIAGNOSIS — R479 Unspecified speech disturbances: Secondary | ICD-10-CM | POA: Diagnosis not present

## 2017-04-05 DIAGNOSIS — F419 Anxiety disorder, unspecified: Secondary | ICD-10-CM | POA: Diagnosis not present

## 2017-04-05 DIAGNOSIS — Z79899 Other long term (current) drug therapy: Secondary | ICD-10-CM | POA: Diagnosis not present

## 2017-04-05 DIAGNOSIS — G479 Sleep disorder, unspecified: Secondary | ICD-10-CM | POA: Diagnosis not present

## 2017-04-05 DIAGNOSIS — Z1389 Encounter for screening for other disorder: Secondary | ICD-10-CM | POA: Diagnosis not present

## 2017-04-05 DIAGNOSIS — F4489 Other dissociative and conversion disorders: Secondary | ICD-10-CM | POA: Diagnosis not present

## 2017-04-05 DIAGNOSIS — R159 Full incontinence of feces: Secondary | ICD-10-CM | POA: Diagnosis not present

## 2017-04-05 DIAGNOSIS — N529 Male erectile dysfunction, unspecified: Secondary | ICD-10-CM | POA: Diagnosis not present

## 2017-04-05 HISTORY — DX: Transient cerebral ischemic attack, unspecified: G45.9

## 2017-04-05 LAB — COMPREHENSIVE METABOLIC PANEL
ALT: 16 U/L — ABNORMAL LOW (ref 17–63)
ANION GAP: 10 (ref 5–15)
AST: 21 U/L (ref 15–41)
Albumin: 3.8 g/dL (ref 3.5–5.0)
Alkaline Phosphatase: 78 U/L (ref 38–126)
BILIRUBIN TOTAL: 0.7 mg/dL (ref 0.3–1.2)
BUN: 11 mg/dL (ref 6–20)
CO2: 25 mmol/L (ref 22–32)
Calcium: 8.9 mg/dL (ref 8.9–10.3)
Chloride: 102 mmol/L (ref 101–111)
Creatinine, Ser: 0.87 mg/dL (ref 0.61–1.24)
GFR calc Af Amer: >60 mL/min (ref >60)
GFR calc non Af Amer: >60 mL/min (ref >60)
Glucose, Bld: 108 mg/dL — ABNORMAL HIGH (ref 65–99)
POTASSIUM: 4 mmol/L (ref 3.5–5.1)
Sodium: 137 mmol/L (ref 135–145)
Total Protein: 6.7 g/dL (ref 6.5–8.1)

## 2017-04-05 LAB — CBC
HEMATOCRIT: 39.9 % (ref 39.0–52.0)
HEMOGLOBIN: 13.2 g/dL (ref 13.0–17.0)
MCH: 31.8 pg (ref 26.0–34.0)
MCHC: 33.1 g/dL (ref 30.0–36.0)
MCV: 96.1 fL (ref 78.0–100.0)
Platelets: 230 10*3/uL (ref 150–400)
RBC: 4.15 MIL/uL — ABNORMAL LOW (ref 4.22–5.81)
RDW: 13.9 % (ref 11.5–15.5)
WBC: 8 10*3/uL (ref 4.0–10.5)

## 2017-04-05 LAB — RAPID URINE DRUG SCREEN, HOSP PERFORMED
AMPHETAMINES: NOT DETECTED
BENZODIAZEPINES: NOT DETECTED
Barbiturates: NOT DETECTED
Cocaine: NOT DETECTED
Opiates: NOT DETECTED
Tetrahydrocannabinol: NOT DETECTED

## 2017-04-05 LAB — DIFFERENTIAL
Basophils Absolute: 0 10*3/uL (ref 0.0–0.1)
Basophils Relative: 0 %
Eosinophils Absolute: 0.1 10*3/uL (ref 0.0–0.7)
Eosinophils Relative: 1 %
Lymphocytes Relative: 13 %
Lymphs Abs: 1.1 10*3/uL (ref 0.7–4.0)
Monocytes Absolute: 0.4 10*3/uL (ref 0.1–1.0)
Monocytes Relative: 5 %
Neutro Abs: 6.7 10*3/uL (ref 1.7–7.7)
Neutrophils Relative %: 81 %

## 2017-04-05 LAB — URINALYSIS, ROUTINE W REFLEX MICROSCOPIC
Bilirubin Urine: NEGATIVE
Glucose, UA: NEGATIVE mg/dL
Hgb urine dipstick: NEGATIVE
Ketones, ur: 5 mg/dL — AB
Leukocytes, UA: NEGATIVE
NITRITE: NEGATIVE
PH: 6 (ref 5.0–8.0)
Protein, ur: NEGATIVE mg/dL
SPECIFIC GRAVITY, URINE: 1.004 — AB (ref 1.005–1.030)

## 2017-04-05 LAB — PROTIME-INR
INR: 1.04
Prothrombin Time: 13.5 seconds (ref 11.4–15.2)

## 2017-04-05 LAB — I-STAT TROPONIN, ED: TROPONIN I, POC: 0 ng/mL (ref 0.00–0.08)

## 2017-04-05 LAB — CBG MONITORING, ED: Glucose-Capillary: 114 mg/dL — ABNORMAL HIGH (ref 65–99)

## 2017-04-05 LAB — APTT: aPTT: 26 seconds (ref 24–36)

## 2017-04-05 LAB — ETHANOL

## 2017-04-05 MED ORDER — PAROXETINE HCL 20 MG PO TABS
20.0000 mg | ORAL_TABLET | Freq: Every day | ORAL | Status: DC
  Administered 2017-04-05: 20 mg via ORAL
  Filled 2017-04-05: qty 1

## 2017-04-05 MED ORDER — ACETAMINOPHEN 650 MG RE SUPP: 650.0000 mg | RECTAL | Status: DC | PRN

## 2017-04-05 MED ORDER — CLOPIDOGREL BISULFATE 75 MG PO TABS
75.0000 mg | ORAL_TABLET | Freq: Every day | ORAL | Status: DC
  Administered 2017-04-06: 75 mg via ORAL
  Filled 2017-04-05: qty 1

## 2017-04-05 MED ORDER — ASPIRIN 325 MG PO TABS
325.0000 mg | ORAL_TABLET | Freq: Every day | ORAL | Status: DC
  Administered 2017-04-06: 325 mg via ORAL
  Filled 2017-04-05: qty 1

## 2017-04-05 MED ORDER — STROKE: EARLY STAGES OF RECOVERY BOOK
Freq: Once | Status: DC
  Filled 2017-04-05: qty 1

## 2017-04-05 MED ORDER — ACETAMINOPHEN 160 MG/5ML PO SOLN: 650.0000 mg | ORAL | Status: DC | PRN

## 2017-04-05 MED ORDER — ATORVASTATIN CALCIUM 80 MG PO TABS: 80.0000 mg | ORAL_TABLET | Freq: Every day | ORAL | Status: DC

## 2017-04-05 MED ORDER — IOPAMIDOL (ISOVUE-370) INJECTION 76%
INTRAVENOUS | Status: AC
  Administered 2017-04-05: 50 mL
  Filled 2017-04-05: qty 50

## 2017-04-05 MED ORDER — ACETAMINOPHEN 325 MG PO TABS: 650.0000 mg | ORAL_TABLET | ORAL | Status: DC | PRN

## 2017-04-05 NOTE — H&P (Addendum)
[facility] Admission History and Physical Service Pager: [phone]  Patient name: Jon Hughes Medical record number: [id number] Date of birth: [birth year] Age: 82 y.o. Gender: male  Primary Care Provider: No primary care provider on file. Consultants: Neuro Code Status: Full Code   Chief Complaint: Trouble with word finding   Assessment and Plan: Jon Hughes is a 82 y.o. male presenting with aphasia. PMH is significant for adenocarcinoma prostate, giant cell arteritis, diverticulosis, alcoholism.   Aphasia - resolved TIA vs. Stroke. Patient presenting with difficulty with word finding beginning this afternoon. Patient's symptoms resolved within 51mn. CT head showing concern for recent and possibly acute infarct in the mid right cerebellum, no hemorrhage or mass. BP of 153/138, no prior h/o HTN. Neurology consulted in ED.  -admit observation, attending Dr. CErin Hearing-vitals per floor protocol -neuro following; appreciate recs -monitor on telemetry -CXR  -echo -permissive hypertension x24 hours -risk strat labs: hgb A1C, lipid, TSH -swallow eval -PT/OT/SLP eval -867matorvastatin -ASA 32580m -Plavix 81m14m Stage T2a High [facility] stating options for patient include Surgery: Radical prostatectomy with lymphadenectomy and Radiation: EBRT + Brachy Boost + ADT 2 years. No note from oncology stating initiation of treatment. Possibly [place] in [place]Mitral valve prolapse Patient reports h/o "mid systolic click". States recent doctors have been able to hear murmur. Does report episodes of palpitations and tachycardia.  - monitor on telemetry  Giant Cell Arteritis  Patient reports history of giant cell arteritis from [id number] he took prednisone during that time but is no longer on medications.   History of Alcoholism   Stopped drinking alcohol in 1986.    Diverticulosis  Patient unsure of when last colonoscopy was, likely >12 y.o.   FEN/GI: NPO pending swallow study, patient needs vegan diet  Prophylaxis: SCDs   Disposition: admit to telemetry, attending Dr. ChamErin Hearingistory of Present Illness:  Jon Hughes 82 y93. male presenting with difficulty with speech. Patient states he was performing as [facility] he "couldn't get words out to introduce himself". He states he couldn't read his script, not because he couldn't read the words but states the words "couldn't come up". States he spent over five minutes trying to get the words in the right order. Denies focal weakness or facial drooping. Within 15 min EMS arrived and informed patient he would need to go to ED. Patient states after 20 min total he was back at baseline. Patient had been seen by PCP, Dr. NeviMarcellus Scottr annual wellness exam and states he was "perfectly healthy". [facility]   Review Of Systems: Per HPI with the following additions:    Review of Systems  Constitutional: Negative for chills and fever.  Respiratory: Negative for shortness of breath.   Cardiovascular: Positive for palpitations. Negative for chest pain.  Genitourinary:       Dribbling  Neurological: Positive for speech change and headaches. Negative for dizziness.    Patient Active Problem List   Diagnosis Date Noted  . TIA (transient ischemic attack) 04/05/2017  . Malignant neoplasm of prostate (HCC)[place]/03/2014    Past Medical History: Past Medical History:  Diagnosis Date  . Cardiac murmur   . Hx of anxiety disorder   . Hx of major depression   . Prostate cancer (HCC)[place]  Past Surgical History: Past Surgical History:  Procedure Laterality Date  . HERNIA REPAIR    . PROSTATE BIOPSY      Social History: Social History   Tobacco Use  . Smoking status: Former Smoker    Packs/day: 1.00     Years: 8.00    Pack years: 8.00    Types: Cigarettes  Substance Use Topics  . Alcohol use: No    Alcohol/week: 0.0 oz  . Drug use: No   Additional social history: lives with wife, no tobacco use (quit smoking in 1968), denies alcohol use (quit drinking in 6256), no illicit drug use (smoked marijuana 50 years ago)  Please also refer to relevant sections of EMR.  Family History: Family History  Problem Relation Age of Onset  . Liver cancer Mother   . Breast cancer Sister   . Multiple myeloma Sister   . Throat cancer Brother    Allergies and Medications: Allergies  Allergen Reactions  . Codeine Nausea Only  . 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    Current Outpatient Medications on File Prior to Encounter  Medication Sig Dispense Refill  . b complex vitamins capsule Take 1 capsule by mouth daily.    . cholecalciferol (VITAMIN D) 1000 units tablet Take 1,000 Units by mouth daily. With K2    . GLUCOSAMINE HCL PO Take 2,000 mg by mouth 2 (two) times daily.    Marland Kitchen OVER THE COUNTER MEDICATION Take 5 mLs by mouth daily. OPC-3 anti oxidant    . PARoxetine (PAXIL) 20 MG tablet Take 20 mg by mouth at bedtime.       Objective: BP (!) 145/94   Pulse 68   Temp 98.9 F (37.2 C)   Resp 17   Ht 5' 11"  (1.803 m)   Wt 155 lb (70.3 kg)   SpO2 95%   BMI 21.62 kg/m  Exam: General: awake and alert, sitting up in bed, NAD, no facial droop  Eyes: PERRL ENTM: moist mucous membranes Neck: supple, non-tender, no LAD Cardiovascular: RRR, no MRG  Respiratory: CTAB, no wheezes, rales, or rhonchi  Gastrointestinal: soft, non tender, non distended, bowel sounds normal  MSK: non tender, no edema, 5/5 muscle strength bilaterally, normal grip strength  Derm: intact, no rashes  Neuro: CN2-12 grossly intact, no finger to nose dysmetria, sensation intact bilaterally  Psych: normal affect   Labs  and Imaging: CBC BMET  Recent Labs  Lab 04/05/17 1615  WBC 8.0  HGB 13.2  HCT 39.9  PLT 230   Recent Labs  Lab 04/05/17 1615  NA 137  K 4.0  CL 102  CO2 25  BUN 11  CREATININE 0.87  GLUCOSE 108*  CALCIUM 8.9      Ref. Range 04/05/2017 18:40  Amphetamines Latest Ref Range: NONE DETECTED  NONE DETECTED  Barbiturates Latest Ref Range: NONE DETECTED  NONE DETECTED  Benzodiazepines Latest Ref Range: NONE DETECTED  NONE DETECTED  Opiates Latest Ref Range: NONE DETECTED  NONE DETECTED  COCAINE Latest Ref Range: NONE DETECTED  NONE DETECTED  Tetrahydrocannabinol Latest Ref Range: NONE DETECTED  NONE DETECTED    Ref. Range 04/05/2017 17:43  Alcohol, Ethyl (B) Latest Ref Range: <10 mg/dL <10    Ref. Range 04/05/2017 16:43  Troponin i, poc Latest Ref Range: 0.00 - 0.08 ng/mL 0.00   Ct Head Wo Contrast  Result Date: 04/05/2017 CLINICAL DATA:  Transient confusion/altered  mental status EXAM: CT HEAD WITHOUT CONTRAST TECHNIQUE: Contiguous axial images were obtained from the base of the skull through the vertex without intravenous contrast. COMPARISON:  None. FINDINGS: Brain: There is mild diffuse atrophy. There is no intracranial mass, hemorrhage, extra-axial fluid collection, or midline shift. There is patchy small vessel disease throughout the centra semiovale bilaterally. There is decreased attenuation in the periphery of the mid right cerebellum which potentially may represent a recent infarct. No other potential recent infarct evident. Foci of basal ganglia calcification are felt to represent a physiologic finding. Vascular: There is no appreciable hyperdense vessel bilaterally. There is calcification in each carotid siphon region. There is also mild calcification in each middle cerebral artery. Skull: Bony calvarium appears intact. Sinuses/Orbits: There is mucosal thickening in several ethmoid air cells bilaterally. There is also slight mucosal thickening in the anterior right sphenoid  sinus. Other paranasal sinuses are clear. Orbits appear symmetric bilaterally. Other: Mastoid air cells are clear. IMPRESSION: Atrophy with patchy supratentorial small vessel disease. Area of decreased attenuation in the periphery of the mid right cerebellum concerning for recent and possibly acute infarct. No other findings suggesting potential recent/acute infarct. No hemorrhage or mass. Foci of arterial vascular calcification noted. There are foci of paranasal sinus disease evident. Electronically Signed   By: Lowella Grip III M.D.   On: 04/05/2017 18:32     Caroline More, DO 04/05/2017, 8:14 PM PGY-1, [place] Intern pager: [phone], text pages welcome   FPTS Upper-Level Resident Addendum  I have independently interviewed and examined the patient. I have discussed the above with the original author and agree with their documentation. My edits for correction/addition/clarification are in pink.Please see also any attending notes.   Lucila Maine, DO PGY-2, [place] Service pager: [phone] (text pages welcome through [place])

## 2017-04-05 NOTE — ED Notes (Signed)
Patient transported to CT 

## 2017-04-05 NOTE — ED Notes (Signed)
Pt bk from CT

## 2017-04-05 NOTE — Consult Note (Addendum)
Requesting Physician: Dr. Thomasene Lot,     Chief Complaint: TIA  History obtained from:  Patient                                                                                                                                           Jon Hughes is an 82 y.o. male with no stroke risk factors.  He is a vegan.  Does not take aspirin.  Patient states that about 230 today he he was playing a role as Jon Hughes when at approximately 230 he noted that he has some difficulty expressing himself and cannot read the paper to which she was supposed to be reading.  This lasted for approximately 10 minutes and then subsided and he had no other deficits.  Currently he is back to baseline with no deficits.  In the event like this has never occurred before he does not have any history of migraine headaches he does not recall having a headache at that time.  He did not know any other neurological symptoms such as weakness, paresthesia, decreased vision, facial droop.  Date last known well: Date: 04/05/2017 Time last known well: Time: 14:30 tPA Given: No: consult note Modified Rankin: Rankin Score=0  NIH stroke scale of 0  Past Medical History:  Diagnosis Date  . Cardiac murmur   . Hx of anxiety disorder   . Hx of major depression   . Prostate cancer [facility])     Past Surgical History:  Procedure Laterality Date  . HERNIA REPAIR    . PROSTATE BIOPSY      Family History  Problem Relation Age of Onset  . Liver cancer Mother   . Breast cancer Sister   . Multiple myeloma Sister   . Throat cancer Brother     Social History:  reports that he has quit smoking. His smoking use included cigarettes. He has a 8.00 pack-year smoking history. He does not have any smokeless tobacco history on file. He reports that he does not drink alcohol or use drugs.  Allergies:  Allergies  Allergen Reactions  . Codeine Nausea Only  . Sulfa Antibiotics     Unknown reaction while in the [employer]    Medications:  No current facility-administered medications for this encounter.    Current Outpatient Medications  Medication Sig Dispense Refill  . b complex vitamins capsule Take 1 capsule by mouth daily.    . cholecalciferol (VITAMIN D) 1000 units tablet Take 1,000 Units by mouth daily. With K2    . GLUCOSAMINE HCL PO Take 2,000 mg by mouth 2 (two) times daily.    Marland Kitchen OVER THE COUNTER MEDICATION Take 5 mLs by mouth daily. OPC-3 anti oxidant    . PARoxetine (PAXIL) 20 MG tablet Take 20 mg by mouth at bedtime.        ROS:                                                                                                                                       History obtained from the patient  General ROS: negative for - chills, fatigue, fever, night sweats, weight gain or weight loss Psychological ROS: negative for - , hallucinations, memory difficulties, mood swings or  Ophthalmic ROS: negative for - blurry vision, double vision, eye pain or loss of vision ENT ROS: negative for - epistaxis, nasal discharge, oral lesions, sore throat, tinnitus or vertigo Respiratory ROS: negative for - cough,  shortness of breath or wheezing Cardiovascular ROS: negative for - chest pain, dyspnea on exertion,  Gastrointestinal ROS: negative for - abdominal pain, diarrhea,  nausea/vomiting or stool incontinence Genito-Urinary ROS: negative for - dysuria, hematuria, incontinence or urinary frequency/urgency Musculoskeletal ROS: negative for - joint swelling or muscular weakness Neurological ROS: as noted in HPI   General Examination:                                                                                                      Blood pressure (!) 159/82, pulse (!) 49, temperature 98.8 F (37.1 C), temperature source Oral, resp. rate 19, height 5' 11"  (1.803 m), weight 70.3 kg (155 lb), SpO2 99  %.  HEENT-  Normocephalic, no lesions, without obvious abnormality.  Normal external eye and conjunctiva.   Cardiovascular- S1-S2 audible, pulses palpable throughout   Lungs-no rhonchi or wheezing noted, no excessive working breathing.  Saturations within normal limits Abdomen- All 4 quadrants palpated and nontender Extremities- Warm, dry and intact Musculoskeletal-no joint tenderness, deformity or swelling Skin-warm and dry, no hyperpigmentation, vitiligo, or suspicious lesions  Neurological Examination Mental Status: Alert, oriented, thought content appropriate.  Speech fluent without evidence of aphasia.  Able to follow 3 step commands without difficulty. Cranial Nerves: II: Discs flat  bilaterally; Visual fields grossly normal,  III,IV, VI: ptosis not present, extra-ocular motions intact bilaterally, pupils equal, round, reactive to light and accommodation V,VII: smile symmetric, facial light touch sensation normal bilaterally VIII: hearing normal bilaterally IX,X: uvula rises symmetrically XI: bilateral shoulder shrug XII: midline tongue extension Motor: Right : Upper extremity   5/5    Left:     Upper extremity   5/5  Lower extremity   5/5     Lower extremity   5/5 Right arm does have slight weakness with tricep extension Tone and bulk:normal tone throughout; no atrophy noted Sensory: Pinprick and light touch intact throughout, bilaterally Deep Tendon Reflexes: 2+ and symmetric throughout Plantars: Right: downgoing   Left: downgoing Cerebellar: normal finger-to-nose, normal rapid alternating movements and normal heel-to-shin test Gait: normal gait and station   Lab Results: Basic Metabolic Panel: Recent Labs  Lab 04/05/17 1615  NA 137  K 4.0  CL 102  CO2 25  GLUCOSE 108*  BUN 11  CREATININE 0.87  CALCIUM 8.9    CBC: Recent Labs  Lab 04/05/17 1615  WBC 8.0  HGB 13.2  HCT 39.9  MCV 96.1  PLT 230    Lipid Panel: No results for input(s): CHOL, TRIG, HDL,  CHOLHDL, VLDL, LDLCALC in the last 168 hours.  CBG: No results for input(s): GLUCAP in the last 168 hours.  Imaging: No results found.  Assessment and plan discussed with with attending physician and they are in agreement.    Jon Quill PA-C [facility] [phone]  04/05/2017, 5:39 PM   Assessment: 82 y.o. male no stroke risk factors.  Patient presents to the hospital secondary to having a 10-minute transient period of expressive aphasia to which she cannot read nor could he express himself.  He is completely back to baseline at this time.  Patient does not take aspirin. His symptoms are highly concerning for TIA.    Stroke Risk Factors - none  Etiology; Cardioembolic vs Atheroembolic  Recommend --PPHK3E, fasting lipid panel --MRI/MRA of the brain without contrast + carotid doppler --PT consult, OT consult, Speech consult --Echocardiogram --80 mg of Atorvistatin -- Start ASA and Plavix x 3 weeks, --Telemetry monitoring --Frequent neuro checks --NPO until passes stroke swallow screen --please page stroke NP  Or  PA  Or MD from 8am -4 pm  as this patient from this time will be  followed by the stroke.   You can look them up on [email or web address]  Password [email or web address]

## 2017-04-05 NOTE — ED Triage Notes (Signed)
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  EMS was at the same home assessing another pt.  They went to check on him because he had to stop his performance because he couldn't remember any of his routine.  After 58min he was back to his normal baseline.  He decided to come in and get checked out and currently has a headache 2/10. AOx4 NAD noted.

## 2017-04-05 NOTE — ED Provider Notes (Signed)
[facility] EMERGENCY DEPARTMENT Provider Note   CSN: [id number] Arrival date & time: 04/05/17  1550     History   Chief Complaint Chief Complaint  Patient presents with  . Altered Mental Status    HPI Jon Hughes is a 82 y.o. male.  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  Patient was just beginning his routine when he all of a sudden lost track of his thoughts, wife reports that he kept repeating words in the wrong order over and over again for around 15 minutes patient was unable to get the words out that he was thinking.  This resolved after approximately 20 minutes.  She denies any slurred speech or weakness.  This is never happened prior.  EMS brought patient for TIA workup  Past Medical History:  Diagnosis Date  . Cardiac murmur   . Hx of anxiety disorder   . Hx of major depression   . Prostate cancer [facility])     Patient Active Problem List   Diagnosis Date Noted  . Malignant neoplasm of prostate ([place]) 03/12/2014    Past Surgical History:  Procedure Laterality Date  . HERNIA REPAIR    . PROSTATE BIOPSY          Home Medications    Prior to Admission medications   Medication Sig Start Date End Date Taking? Authorizing Provider  b complex vitamins capsule Take 1 capsule by mouth daily.   Yes [provider]  cholecalciferol (VITAMIN D) 1000 units tablet Take 1,000 Units by mouth daily. With K2   Yes [provider]  GLUCOSAMINE HCL PO Take 2,000 mg by mouth 2 (two) times daily.   Yes [provider]  OVER THE COUNTER MEDICATION Take 5 mLs by mouth daily. OPC-3 anti oxidant   Yes [provider]  PARoxetine (PAXIL) 20 MG tablet Take 20 mg by mouth at bedtime.    Yes [provider]    Family History Family History  Problem Relation Age of Onset  . Liver cancer Mother   . Breast cancer  Sister   . Multiple myeloma Sister   . Throat cancer Brother     Social History Social History   Tobacco Use  . Smoking status: Former Smoker    Packs/day: 1.00    Years: 8.00    Pack years: 8.00    Types: Cigarettes  Substance Use Topics  . Alcohol use: No    Alcohol/week: 0.0 oz  . Drug use: No     Allergies   Codeine and Sulfa antibiotics   Review of Systems Review of Systems  Constitutional: Negative for activity change.  Respiratory: Negative for shortness of breath.   Cardiovascular: Negative for chest pain.  Gastrointestinal: Negative for abdominal pain.  Neurological: Positive for speech difficulty. Negative for dizziness, facial asymmetry, light-headedness and headaches.  All other systems reviewed and are negative.    Physical Exam Updated Vital Signs BP (!) 159/82   Pulse (!) 49   Temp 98.8 F (37.1 C) (Oral)   Resp 19   Ht 5' 11" (1.803 m)   Wt 70.3 kg (155 lb)   SpO2 99%   BMI 21.62 kg/m   Physical Exam  Constitutional: He appears well-nourished.  82 year old male appearing younger than his stating age with [name] make up on  HENT:  Head: Normocephalic.  Eyes: Conjunctivae are  normal.  Cardiovascular: Normal rate and regular rhythm.  No murmur heard. Pulmonary/Chest: Effort normal and breath sounds normal. No respiratory distress.  Abdominal: Soft. He exhibits no distension. There is no tenderness.  Neurological: He displays normal reflexes. No cranial nerve deficit or sensory deficit. He exhibits normal muscle tone. Coordination normal.  Equal strength bilaterally upper and lower extremities negative pronator drift. Normal sensation bilaterally. Speech comprehensible, no slurring. Facial nerve tested and appears grossly normal. Alert and oriented 3.   Skin: Skin is warm and dry. He is not diaphoretic.  Psychiatric: He has a normal mood and affect. His behavior is normal.     ED Treatments / Results  Labs (all labs ordered are  listed, but only abnormal results are displayed) Labs Reviewed  CBC - Abnormal; Notable for the following components:      Result Value   RBC 4.15 (*)    All other components within normal limits  COMPREHENSIVE METABOLIC PANEL  ETHANOL  PROTIME-INR  APTT  DIFFERENTIAL  RAPID URINE DRUG SCREEN, HOSP PERFORMED  URINALYSIS, ROUTINE W REFLEX MICROSCOPIC  I-STAT TROPONIN, ED  CBG MONITORING, ED    EKG None  Radiology No results found.  Procedures Procedures (including critical care time)  Medications Ordered in ED Medications - No data to display   Initial Impression / Assessment and Plan / ED Course  I have reviewed the triage vital signs and the nursing notes.  Pertinent labs & imaging results that were available during my care of the patient were reviewed by me and considered in my medical decision making (see chart for details).    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  Patient was just beginning his routine when he all of a sudden lost track of his thoughts, wife reports that he kept repeating words in the wrong order over and over again for around 15 minutes patient was unable to get the words out that he was thinking.  This resolved after approximately 20 minutes.  She denies any slurred speech or weakness.  This is never happened prior.  EMS brought patient for TIA workup   5:05 PM Given the patient was having difficulty using his words, I am concerned that this was a TIA.  Will initiate stroke workup, admit for MRI, TIA workup.  Final Clinical Impressions(s) / ED Diagnoses   Final diagnoses:  None    ED Discharge Orders    None       Mackuen, Courteney Lyn, MD 04/06/17 1324  

## 2017-04-06 ENCOUNTER — Observation Stay (HOSPITAL_COMMUNITY): Payer: Medicare Other

## 2017-04-06 ENCOUNTER — Observation Stay (HOSPITAL_BASED_OUTPATIENT_CLINIC_OR_DEPARTMENT_OTHER): Payer: Medicare Other

## 2017-04-06 DIAGNOSIS — E785 Hyperlipidemia, unspecified: Secondary | ICD-10-CM

## 2017-04-06 DIAGNOSIS — Z87898 Personal history of other specified conditions: Secondary | ICD-10-CM

## 2017-04-06 DIAGNOSIS — G459 Transient cerebral ischemic attack, unspecified: Principal | ICD-10-CM

## 2017-04-06 DIAGNOSIS — I361 Nonrheumatic tricuspid (valve) insufficiency: Secondary | ICD-10-CM

## 2017-04-06 DIAGNOSIS — I6523 Occlusion and stenosis of bilateral carotid arteries: Secondary | ICD-10-CM | POA: Diagnosis not present

## 2017-04-06 LAB — LIPID PANEL
CHOL/HDL RATIO: 4.7 ratio
Cholesterol: 168 mg/dL (ref 0–200)
HDL: 36 mg/dL — AB (ref >40)
LDL CALC: 111 mg/dL — AB (ref 0–99)
TRIGLYCERIDES: 107 mg/dL (ref <150)
VLDL: 21 mg/dL (ref 0–40)

## 2017-04-06 LAB — BASIC METABOLIC PANEL
ANION GAP: 7 (ref 5–15)
BUN: 9 mg/dL (ref 6–20)
CALCIUM: 8.9 mg/dL (ref 8.9–10.3)
CO2: 27 mmol/L (ref 22–32)
Chloride: 104 mmol/L (ref 101–111)
Creatinine, Ser: 0.85 mg/dL (ref 0.61–1.24)
GFR calc non Af Amer: >60 mL/min (ref >60)
Glucose, Bld: 98 mg/dL (ref 65–99)
POTASSIUM: 3.6 mmol/L (ref 3.5–5.1)
Sodium: 138 mmol/L (ref 135–145)

## 2017-04-06 LAB — HEMOGLOBIN A1C
Hgb A1c MFr Bld: 5.7 % — ABNORMAL HIGH (ref 4.8–5.6)
Mean Plasma Glucose: 116.89 mg/dL

## 2017-04-06 LAB — CBC
HEMATOCRIT: 40 % (ref 39.0–52.0)
HEMOGLOBIN: 12.9 g/dL — AB (ref 13.0–17.0)
MCH: 30.8 pg (ref 26.0–34.0)
MCHC: 32.3 g/dL (ref 30.0–36.0)
MCV: 95.5 fL (ref 78.0–100.0)
Platelets: 216 10*3/uL (ref 150–400)
RBC: 4.19 MIL/uL — AB (ref 4.22–5.81)
RDW: 13.6 % (ref 11.5–15.5)
WBC: 5.8 10*3/uL (ref 4.0–10.5)

## 2017-04-06 LAB — ECHOCARDIOGRAM COMPLETE
Height: 71 in
WEIGHTICAEL: 2476.21 [oz_av]

## 2017-04-06 LAB — TSH: TSH: 1.4 u[IU]/mL (ref 0.350–4.500)

## 2017-04-06 MED ORDER — CLOPIDOGREL BISULFATE 75 MG PO TABS: 75.0000 mg | ORAL_TABLET | Freq: Every day | ORAL | 0 refills | Status: DC

## 2017-04-06 MED ORDER — ASPIRIN 325 MG PO TABS: 325.0000 mg | ORAL_TABLET | Freq: Every day | ORAL | 0 refills | Status: DC

## 2017-04-06 MED ORDER — ATORVASTATIN CALCIUM 80 MG PO TABS: 80.0000 mg | ORAL_TABLET | Freq: Every day | ORAL | 0 refills | Status: DC

## 2017-04-06 MED ORDER — PAROXETINE HCL 10 MG PO TABS: 10.0000 mg | ORAL_TABLET | Freq: Every day | ORAL | 0 refills | Status: DC

## 2017-04-06 MED ORDER — CLOPIDOGREL BISULFATE 75 MG PO TABS: 75.0000 mg | ORAL_TABLET | Freq: Every day | ORAL | 0 refills | Status: AC

## 2017-04-06 NOTE — Care Management Note (Signed)
Case Management Note  Patient Details  Name: Jon Hughes MRN: [id number] Date of Birth: December 27, 1935  Subjective/Objective:                    Action/Plan: Pt discharging home with self care.  PCP: Dr Mertha Finders. Pt has insurance and transportation home.    Expected Discharge Date:  04/06/17               Expected Discharge Plan:  Home/Self Care  In-House Referral:     Discharge planning Services     Post Acute Care Choice:    Choice offered to:     DME Arranged:    DME Agency:     HH Arranged:    HH Agency:     Status of Service:  Completed, signed off  If discussed at [employer] of Stay Meetings, dates discussed:    Additional Comments:  Pollie Friar, RN 04/06/2017, 4:21 PM

## 2017-04-06 NOTE — Progress Notes (Signed)
OT Cancellation Note  Patient Details Name: Jon Hughes MRN: [id number] DOB: [birth year]   Cancelled Treatment:    Reason Eval/Treat Not Completed: OT screened, no needs identified, will sign off.  MRI negative and per PT, pt back to baseline.  [place], OTR/L [phone]   Lucille Passy M 04/06/2017, 2:56 PM

## 2017-04-06 NOTE — Progress Notes (Signed)
PT Cancellation Note  Patient Details Name: Jon Hughes MRN: [id number] DOB: [birth year]   Cancelled Treatment:    Reason Eval/Treat Not Completed: Patient at procedure or test/unavailable. Pt currently off unit for testing. Will check back and complete PT evaluation when pt available.    Thelma Comp 04/06/2017, 1:33 PM   Rolinda Roan, PT, DPT Acute Rehabilitation Services Pager: [phone]

## 2017-04-06 NOTE — Progress Notes (Addendum)
Patient arrived to floor with no complaints of pain.

## 2017-04-06 NOTE — Progress Notes (Signed)
PT Cancellation Note  Patient Details Name: Jon Hughes MRN: [id number] DOB: [birth year]   Cancelled Treatment:    Reason Eval/Treat Not Completed: PT screened, no needs identified, will sign off. Patient received standing at sink. No apparent balance deficits with SLS. Patient reporting all symptoms have resolved and has no further questions at this time. Thank you for the consult, please re-consult if status changes.  Ellamae Sia, PT, DPT Acute Rehabilitation Services  Pager: [phone]    Willy Eddy 04/06/2017, 2:57 PM

## 2017-04-06 NOTE — Progress Notes (Signed)
*  PRELIMINARY RESULTS* Vascular Ultrasound Carotid Duplex (Doppler) has been completed. Findings suggest 1-39% internal carotid artery stenosis bilaterally. Vertebral arteries are patent with antegrade flow.  04/06/2017 2:36 PM Maudry Mayhew, BS, RVT, RDCS, RDMS

## 2017-04-06 NOTE — Progress Notes (Addendum)
Family Medicine Teaching Service Daily Progress Note Intern Pager: [phone]  Patient name: Jon Hughes Medical record number: [id number] Date of birth: [birth year] Age: 82 y.o. Gender: male  Primary Care Provider: No primary care provider on file. Consultants: Neurology Code Status: Full  Pt Overview and Major Events to Date:  Jon Hughes is a 82 y.o. male presenting with aphasia. PMH is significant for adenocarcinoma prostate, giant cell arteritis, diverticulosis, alcoholism.   Assessment and Plan:  Aphasia 2/2 TIA- resolved Likely TIA rather than stroke. Patient's symptoms resolved within 23min. BP of 143/80, no prior h/o HTN. Neurology consulted in ED.  -neuro following; appreciate recs. 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 No note from oncology stating initiation of treatment. Possibly receiving care in [place].   ?Mitral valve prolapse Patient reports h/o "mid systolic click". States recent doctors have been able to hear murmur. Does report episodes of palpitations and tachycardia.  - monitor on telemetry  Giant Cell Arteritis  Patient reports history of giant cell arteritis from [phone]. States he took prednisone during that time but is no longer on medications.   History of Alcoholism   Stopped drinking alcohol in 1986.   Diverticulosis  Patient unsure of when last colonoscopy was, likely >12 y.o.   Depression, stable: Well controlled on Paxil and has been on this with years. Follows with psych outpatient. He is willing to decrease dose while on Plavix. Patient will discuss with psychologist what to change this to as this is a [employer]  list drug- however patient with no falls and normal sodium.   FEN/GI: Regular diet Prophylaxis: SCDs   Disposition: medically stable for discharge  Subjective:  Patient feels great and has already spoken with PCP to get an appointment. He is ready to go home.   Objective: Temp:  [97.6 F (36.4 C)-98.9 F (37.2 C)] 97.6 F (36.4 C) (03/29 0400) Pulse Rate:  [49-80] 58 (03/29 0400) Resp:  [14-29] 18 (03/29 0400) BP: (116-159)/(66-98) 139/67 (03/29 0400) SpO2:  [93 %-99 %] 97 % (03/29 0400) Weight:  [154 lb 12.2 oz (70.2 kg)-155 lb (70.3 kg)] 154 lb 12.2 oz (70.2 kg) (03/29 0200) Physical Exam: General: NAD, pleasant Eyes: PERRL, EOMI, no conjunctival pallor or injection ENTM: Moist mucous membranes Cardiovascular: RRR, no m/r/g, no LE edema Respiratory: CTA BL, normal work of breathing Gastrointestinal: soft, nontender, nondistended, normoactive BS MSK: moves 4 extremities equally Derm: no rashes appreciated Neuro: CN II-XII grossly intact, BLUE and BLLE with 5/5 strength Psych: AOx3, appropriate affect  Laboratory: Recent Labs  Lab 04/05/17 1615 04/06/17 0225  WBC 8.0 5.8  HGB 13.2 12.9*  HCT 39.9 40.0  PLT 230 216   Recent Labs  Lab 04/05/17 1615 04/06/17 0225  NA 137 138  K 4.0 3.6  CL 102 104  CO2 25 27  BUN 11 9  CREATININE 0.87 0.85  CALCIUM 8.9 8.9  PROT 6.7  --   BILITOT 0.7  --   ALKPHOS 78  --   ALT 16*  --   AST 21  --   GLUCOSE 108* 98   Hgb A1c 5.7 TSH 1.400 UDS neg Ethanol <10 UA neg Lipid Panel     Component Value Date/Time  CHOL 168 04/06/2017 0225   TRIG 107 04/06/2017 0225   HDL 36 (L) 04/06/2017 0225   CHOLHDL 4.7 04/06/2017 0225   VLDL 21 04/06/2017 0225   LDLCALC 111 (H) 04/06/2017 0225   Imaging/Diagnostic Tests: Ct Angio Head W Or Wo Contrast  Result Date: 04/06/2017 CLINICAL DATA:  82 y/o  M; TIA, initial exam. EXAM: CT ANGIOGRAPHY HEAD AND NECK TECHNIQUE: Multidetector CT imaging of the head and neck was performed  using the standard protocol during bolus administration of intravenous contrast. Multiplanar CT image reconstructions and MIPs were obtained to evaluate the vascular anatomy. Carotid stenosis measurements (when applicable) are obtained utilizing NASCET criteria, using the distal internal carotid diameter as the denominator. CONTRAST:  53mL ISOVUE-370 IOPAMIDOL (ISOVUE-370) INJECTION 76% COMPARISON:  04/05/2017 CT head FINDINGS: CTA NECK FINDINGS Aortic arch: Standard branching. Imaged portion shows no evidence of aneurysm or dissection. No significant stenosis of the major arch vessel origins. Moderate calcific atherosclerosis. Right carotid system: No evidence of dissection, stenosis (50% or greater) or occlusion. Moderate mixed plaque of the carotid bifurcation with minimal less than 30% proximal ICA stenosis. Left carotid system: No evidence of dissection, stenosis (50% or greater) or occlusion. Moderate mixed plaque of the carotid bifurcation with minimal less than 30% proximal ICA stenosis. Vertebral arteries: Codominant. No evidence of dissection, stenosis (50% or greater) or occlusion. Skeleton: Moderate cervical spondylosis with discogenic degenerative changes greatest at the C5-C7 levels and C2-3 level. Multilevel facet arthrosis. Uncovertebral and facet hypertrophy encroaches on the neural foramen at the right C4-C7 levels and left C2-T1 levels. No high-grade bony canal stenosis. Other neck: Negative. Upper chest: Negative. Review of the MIP images confirms the above findings CTA HEAD FINDINGS Anterior circulation: No significant stenosis, proximal occlusion, aneurysm, or vascular malformation. Mild non stenotic calcific atherosclerosis of the carotid siphons. Posterior circulation: No significant stenosis, proximal occlusion, aneurysm, or vascular malformation. Venous sinuses: As permitted by contrast timing, patent. Anatomic variants: Anterior communicating artery and right posterior communicating  artery. No left posterior communicating artery identified, likely hypoplastic or absent. Delayed phase: No abnormal intracranial enhancement. Review of the MIP images confirms the above findings IMPRESSION: 1. Patent carotid and vertebral arteries. No dissection, aneurysm, or hemodynamically significant stenosis utilizing NASCET criteria. 2. Patent anterior and posterior intracranial circulation. No large vessel occlusion, aneurysm, or significant stenosis. 3. Moderate atherosclerosis of the aorta and carotid bifurcations. 4. Moderate cervical spondylosis with multilevel bony foraminal stenosis. No high-grade bony canal stenosis. Electronically Signed   By: Kristine Garbe M.D.   On: 04/06/2017 00:56   Dg Chest 2 View  Result Date: 04/05/2017 CLINICAL DATA:  TIA EXAM: CHEST - 2 VIEW COMPARISON:  None. FINDINGS: The heart size and mediastinal contours are within normal limits. Both lungs are clear. The visualized skeletal structures are unremarkable. Aortic atherosclerosis. IMPRESSION: No active cardiopulmonary disease. Electronically Signed   By: Donavan Foil M.D.   On: 04/05/2017 22:49   Ct Head Wo Contrast  Result Date: 04/05/2017 CLINICAL DATA:  Transient confusion/altered mental status EXAM: CT HEAD WITHOUT CONTRAST TECHNIQUE: Contiguous axial images were obtained from the base of the skull through the vertex without intravenous contrast. COMPARISON:  None. FINDINGS: Brain: There is mild diffuse atrophy. There is no intracranial mass, hemorrhage, extra-axial fluid collection, or midline shift. There is patchy small vessel disease throughout the centra semiovale bilaterally. There is decreased attenuation in the periphery of the mid right cerebellum which potentially may represent a recent infarct. No other potential recent infarct evident. Foci of  basal ganglia calcification are felt to represent a physiologic finding. Vascular: There is no appreciable hyperdense vessel bilaterally. There is  calcification in each carotid siphon region. There is also mild calcification in each middle cerebral artery. Skull: Bony calvarium appears intact. Sinuses/Orbits: There is mucosal thickening in several ethmoid air cells bilaterally. There is also slight mucosal thickening in the anterior right sphenoid sinus. Other paranasal sinuses are clear. Orbits appear symmetric bilaterally. Other: Mastoid air cells are clear. IMPRESSION: Atrophy with patchy supratentorial small vessel disease. Area of decreased attenuation in the periphery of the mid right cerebellum concerning for recent and possibly acute infarct. No other findings suggesting potential recent/acute infarct. No hemorrhage or mass. Foci of arterial vascular calcification noted. There are foci of paranasal sinus disease evident. Electronically Signed   By: Lowella Grip III M.D.   On: 04/05/2017 18:32   Ct Angio Neck W Or Wo Contrast  Result Date: 04/06/2017 CLINICAL DATA:  82 y/o  M; TIA, initial exam. EXAM: CT ANGIOGRAPHY HEAD AND NECK TECHNIQUE: Multidetector CT imaging of the head and neck was performed using the standard protocol during bolus administration of intravenous contrast. Multiplanar CT image reconstructions and MIPs were obtained to evaluate the vascular anatomy. Carotid stenosis measurements (when applicable) are obtained utilizing NASCET criteria, using the distal internal carotid diameter as the denominator. CONTRAST:  100mL ISOVUE-370 IOPAMIDOL (ISOVUE-370) INJECTION 76% COMPARISON:  04/05/2017 CT head FINDINGS: CTA NECK FINDINGS Aortic arch: Standard branching. Imaged portion shows no evidence of aneurysm or dissection. No significant stenosis of the major arch vessel origins. Moderate calcific atherosclerosis. Right carotid system: No evidence of dissection, stenosis (50% or greater) or occlusion. Moderate mixed plaque of the carotid bifurcation with minimal less than 30% proximal ICA stenosis. Left carotid system: No evidence of  dissection, stenosis (50% or greater) or occlusion. Moderate mixed plaque of the carotid bifurcation with minimal less than 30% proximal ICA stenosis. Vertebral arteries: Codominant. No evidence of dissection, stenosis (50% or greater) or occlusion. Skeleton: Moderate cervical spondylosis with discogenic degenerative changes greatest at the C5-C7 levels and C2-3 level. Multilevel facet arthrosis. Uncovertebral and facet hypertrophy encroaches on the neural foramen at the right C4-C7 levels and left C2-T1 levels. No high-grade bony canal stenosis. Other neck: Negative. Upper chest: Negative. Review of the MIP images confirms the above findings CTA HEAD FINDINGS Anterior circulation: No significant stenosis, proximal occlusion, aneurysm, or vascular malformation. Mild non stenotic calcific atherosclerosis of the carotid siphons. Posterior circulation: No significant stenosis, proximal occlusion, aneurysm, or vascular malformation. Venous sinuses: As permitted by contrast timing, patent. Anatomic variants: Anterior communicating artery and right posterior communicating artery. No left posterior communicating artery identified, likely hypoplastic or absent. Delayed phase: No abnormal intracranial enhancement. Review of the MIP images confirms the above findings IMPRESSION: 1. Patent carotid and vertebral arteries. No dissection, aneurysm, or hemodynamically significant stenosis utilizing NASCET criteria. 2. Patent anterior and posterior intracranial circulation. No large vessel occlusion, aneurysm, or significant stenosis. 3. Moderate atherosclerosis of the aorta and carotid bifurcations. 4. Moderate cervical spondylosis with multilevel bony foraminal stenosis. No high-grade bony canal stenosis. Electronically Signed   By: Kristine Garbe M.D.   On: 04/06/2017 00:56   Mr Brain Wo Contrast  Result Date: 04/06/2017 CLINICAL DATA:  82 y/o  M; TIA, initial exam. EXAM: MRI HEAD WITHOUT CONTRAST TECHNIQUE:  Multiplanar, multiecho pulse sequences of the brain and surrounding structures were obtained without intravenous contrast. COMPARISON:  04/05/2017 CT head and 04/06/2017 CTA head. FINDINGS: Brain: No acute infarction,  hemorrhage, hydrocephalus, extra-axial collection or mass lesion. Severalnonspecific foci of T2 FLAIR hyperintense signal abnormality in subcortical and periventricular white matter are compatible withmildchronic microvascular ischemic changes for age. Mildbrain parenchymal volume loss. Vascular: Normal flow voids. Skull and upper cervical spine: Normal marrow signal. Sinuses/Orbits: Mild paranasal sinus mucosal thickening. No abnormal signal of mastoid air cells. Bilateral intra-ocular lens replacement. Other: None. IMPRESSION: 1. No acute intracranial abnormality identified. 2. Mild for age chronic microvascular ischemic changes and parenchymal volume loss of the brain. 3. Mild paranasal sinus disease. Electronically Signed   By: Kristine Garbe M.D.   On: 04/06/2017 01:49    Aamirah Salmi, [place], DO 04/06/2017, 7:56 AM PGY-1, [place] Intern pager: [phone], text pages welcome

## 2017-04-06 NOTE — Plan of Care (Signed)
  Problem: Education: Goal: Knowledge of disease or condition will improve Outcome: Completed/Met Goal: Knowledge of secondary prevention will improve Outcome: Completed/Met Goal: Knowledge of patient specific risk factors addressed and post discharge goals established will improve Outcome: Completed/Met   Problem: Coping: Goal: Will verbalize positive feelings about self Outcome: Completed/Met   Problem: Ischemic Stroke/TIA Tissue Perfusion: Goal: Complications of ischemic stroke/TIA will be minimized Outcome: Completed/Met   Problem: Education: Goal: Knowledge of General Education information will improve Outcome: Completed/Met   Problem: Clinical Measurements: Goal: Ability to maintain clinical measurements within normal limits will improve Outcome: Completed/Met   Problem: Coping: Goal: Level of anxiety will decrease Outcome: Completed/Met   Problem: Pain Managment: Goal: General experience of comfort will improve Outcome: Completed/Met   Problem: Safety: Goal: Ability to remain free from injury will improve Outcome: Completed/Met   Problem: Skin Integrity: Goal: Risk for impaired skin integrity will decrease Outcome: Completed/Met

## 2017-04-06 NOTE — Progress Notes (Signed)
Discharge instructions explained and provided to patient verbalized understanding. No c/o pain or shortness of breath at d/c.  Cashlyn Huguley, Tivis Ringer, RN

## 2017-04-06 NOTE — Discharge Summary (Signed)
[facility] Discharge Summary  Patient name: Jon Hughes Medical record number: [id number] Date of birth: 11-21-1935 Age: 82 y.o. Gender: male Date of Admission: 04/05/2017  Date of Discharge: 04/06/17 Admitting Physician: Lind Covert, MD  Primary Care Provider: No primary care provider on file. Consultants: Neurology  Indication for Hospitalization: aphasia  Discharge Diagnoses/Problem List:  Aphasia 2/2 TIA  Stage T2a high risk adenocarcinoma of the  Prostate ?  Mitral valve prolapse Giant cell arteritis History of alcoholism Depression H/o Diverticulosis  Disposition: Home  Discharge Condition: Stable  Discharge Exam:  General: NAD, pleasant Eyes: PERRL, EOMI, no conjunctival pallor or injection ENTM: Moist mucous membranes Cardiovascular: RRR, no m/r/g, no LE edema Respiratory: CTA BL, normal work of breathing Gastrointestinal: soft, nontender, nondistended, normoactive BS MSK: moves 4 extremities equally Derm: no rashes appreciated Neuro: CN II-XII grossly intact, BLUE and BLLE with 5/5 strength Psych: AOx3, appropriate affect  Brief Hospital Course:  This 82 year old male presented with difficulty of speech after having an episode of aphasia.  Patient reported that he was back to baseline after 20 minutes.  On arrival to the ED patient had CT head which showed Atrophy with patchy supratentorial small vessel disease. Area of decreased attenuation in the periphery of the mid right cerebellum concerning for recent and possibly acute infarct. No other findings suggesting potential recent/acute infarct. No hemorrhage or mass.  Neurology was consulted.  Recommended starting patient on aspirin, Plavix, and statin.  Patient also obtained echo and carotid ultrasound.  Patient was allowed to have permissive hypertension for 24 hours.  During admission patient was back to baseline and PT/OT/SLP did not recommend any follow-up given that  patient had no more deficits.  Patient to follow-up with PCP and with stroke clinic at [facility] in 4 weeks.  Issues for Follow Up:  1. Patient started on Lipitor 80 mg to be continued daily. 2. Patient started on aspirin 325 mg to be continued indefinitely, also started on Plavix to be continued for 3 weeks and then stopped. 3. Patient may need 30-day cardiac monitoring given reported palpitations. 4. Patient's Paxil decreased to 10 mg a day while on dual antiplatelet therapy given slight increased risk of bleed.  May discuss this with PCP and psychiatrist. 5. Patient to follow-up with stroke clinic in 4 weeks  Significant Procedures: None  Significant Labs and Imaging:  Recent Labs  Lab 04/05/17 1615 04/06/17 0225  WBC 8.0 5.8  HGB 13.2 12.9*  HCT 39.9 40.0  PLT 230 216   Recent Labs  Lab 04/05/17 1615 04/06/17 0225  NA 137 138  K 4.0 3.6  CL 102 104  CO2 25 27  GLUCOSE 108* 98  BUN 11 9  CREATININE 0.87 0.85  CALCIUM 8.9 8.9  ALKPHOS 78  --   AST 21  --   ALT 16*  --   ALBUMIN 3.8  --    Hgb A1c 5.7 TSH 1.400 UDS neg Ethanol <10 UA neg  Lipid Panel     Component Value Date/Time   CHOL 168 04/06/2017 0225   TRIG 107 04/06/2017 0225   HDL 36 (L) 04/06/2017 0225   CHOLHDL 4.7 04/06/2017 0225   VLDL 21 04/06/2017 0225   LDLCALC 111 (H) 04/06/2017 0225   ECHO: Study Conclusions  - Left ventricle: The cavity size was normal. Wall thickness was   increased in a pattern of mild LVH. Systolic function was normal.   The estimated ejection fraction was in  the range of 60% to 65%.   Wall motion was normal; there were no regional wall motion   abnormalities. Doppler parameters are consistent with abnormal   left ventricular relaxation (grade 1 diastolic dysfunction). The   E/e&' ratio is <8, suggesting normal LV filling pressure. - Mitral valve: Mildly thickened leaflets . There was mild   regurgitation. - Left atrium: The atrium was  normal in size. - Tricuspid valve: There was mild regurgitation. - Pulmonic valve: There was mild regurgitation. - Pulmonary arteries: PA peak pressure: 30 mm Hg (S). - Inferior vena cava: The vessel was normal in size. The respirophasic diameter changes were in the normal range (>= 50%), consistent with normal central venous pressure.  Results/Tests Pending at Time of Discharge:  Final reading of bilateral carotid US  Discharge Medications:  Allergies as of 04/06/2017      Reactions   Codeine Nausea Only   Sulfa Antibiotics    Unknown reaction while in the [employer]      Medication List    TAKE these medications   aspirin 325 MG tablet Take 1 tablet (325 mg total) by mouth daily. Start taking on:  04/07/2017   atorvastatin 80 MG tablet Commonly known as:  LIPITOR Take 1 tablet (80 mg total) by mouth daily at 6 PM.   b complex vitamins capsule Take 1 capsule by mouth daily.   cholecalciferol 1000 units tablet Commonly known as:  VITAMIN D Take 1,000 Units by mouth daily. With K2   clopidogrel 75 MG tablet Commonly known as:  PLAVIX Take 1 tablet (75 mg total) by mouth daily for 21 days. Start taking on:  04/07/2017   GLUCOSAMINE HCL PO Take 2,000 mg by mouth 2 (two) times daily.   OVER THE COUNTER MEDICATION Take 5 mLs by mouth daily. OPC-3 anti oxidant   PARoxetine 10 MG tablet Commonly known as:  PAXIL Take 1 tablet (10 mg total) by mouth at bedtime. What changed:    medication strength  how much to take       Discharge Instructions: Please refer to Patient Instructions section of EMR for full details.  Patient was counseled important signs and symptoms that should prompt return to medical care, changes in medications, dietary instructions, activity restrictions, and follow up appointments.   Follow-Up Appointments: Follow-up Information    [facility] Follow up in 4 week(s).   Specialty:  Neurology Why:  stroke clinic. office will call  with appt date and time Contact information: [address] [place], [place], [place] 04/06/2017, 11:13 PM PGY-1, [place]

## 2017-04-06 NOTE — Discharge Instructions (Signed)
You were admitted to the hospital after having a transient ischemic accident, TIA.  We are glad you are feeling much better! You will need to take aspirin 325 mg daily and atorvastatin 80 mg daily until advised to stop by her doctor.  You will need to take Plavix for 3 weeks.  We have decreased her Paxil to 10 mg daily while on Plavix.  Please discuss this change with your psychiatrist and your primary care physician.  It is very important that you follow-up with your primary care physician as soon as possible.  If you begin to have any symptoms such as weakness, numbness, or change in speech please return to ED.

## 2017-04-06 NOTE — ED Notes (Signed)
Pt going to MRI from CT

## 2017-04-06 NOTE — Progress Notes (Addendum)
STROKE TEAM PROGRESS NOTE   SUBJECTIVE (INTERVAL HISTORY) His lady friend is at the bedside.  He is animated and entertaining. Shared hx and workup thus far.  He does have a hx of palpitations w/ neg 14d monitoring many years ago.  He reported frontal HA yesterday, he thought sinus related or hunger HA.    OBJECTIVE Vitals:   04/06/17 0200 04/06/17 0400 04/06/17 0832 04/06/17 1226  BP: 132/83 139/67 (!) 143/80 118/67  Pulse: (!) 58 (!) 58 65 66  Resp: 18 18 20 20   Temp: 97.9 F (36.6 C) 97.6 F (36.4 C) 98.7 F (37.1 C) 98.8 F (37.1 C)  TempSrc: Oral Oral Oral Oral  SpO2: 97% 97% 95% 95%  Weight: 70.2 kg (154 lb 12.2 oz)     Height: 5\' 11"  (1.803 m)       CBC:  Recent Labs  Lab 04/05/17 1615 04/06/17 0225  WBC 8.0 5.8  NEUTROABS 6.7  --   HGB 13.2 12.9*  HCT 39.9 40.0  MCV 96.1 95.5  PLT 230 481    Basic Metabolic Panel:  Recent Labs  Lab 04/05/17 1615 04/06/17 0225  NA 137 138  K 4.0 3.6  CL 102 104  CO2 25 27  GLUCOSE 108* 98  BUN 11 9  CREATININE 0.87 0.85  CALCIUM 8.9 8.9    Lipid Panel:     Component Value Date/Time   CHOL 168 04/06/2017 0225   TRIG 107 04/06/2017 0225   HDL 36 (L) 04/06/2017 0225   CHOLHDL 4.7 04/06/2017 0225   VLDL 21 04/06/2017 0225   LDLCALC 111 (H) 04/06/2017 0225   HgbA1c:  Lab Results  Component Value Date   HGBA1C 5.7 (H) 04/06/2017   Urine Drug Screen:     Component Value Date/Time   LABOPIA NONE DETECTED 04/05/2017 1840   COCAINSCRNUR NONE DETECTED 04/05/2017 1840   LABBENZ NONE DETECTED 04/05/2017 1840   AMPHETMU NONE DETECTED 04/05/2017 1840   THCU NONE DETECTED 04/05/2017 1840   LABBARB NONE DETECTED 04/05/2017 1840    Alcohol Level     Component Value Date/Time   ETH <10 04/05/2017 1743    IMAGING  Ct Angio Head W Or Wo Contrast  Result Date: 04/06/2017 CLINICAL DATA:  82 y/o  M; TIA, initial exam. EXAM: CT ANGIOGRAPHY HEAD AND NECK TECHNIQUE: Multidetector CT imaging of the head and neck  was performed using the standard protocol during bolus administration of intravenous contrast. Multiplanar CT image reconstructions and MIPs were obtained to evaluate the vascular anatomy. Carotid stenosis measurements (when applicable) are obtained utilizing NASCET criteria, using the distal internal carotid diameter as the denominator. CONTRAST:  70mL ISOVUE-370 IOPAMIDOL (ISOVUE-370) INJECTION 76% COMPARISON:  04/05/2017 CT head FINDINGS: CTA NECK FINDINGS Aortic arch: Standard branching. Imaged portion shows no evidence of aneurysm or dissection. No significant stenosis of the major arch vessel origins. Moderate calcific atherosclerosis. Right carotid system: No evidence of dissection, stenosis (50% or greater) or occlusion. Moderate mixed plaque of the carotid bifurcation with minimal less than 30% proximal ICA stenosis. Left carotid system: No evidence of dissection, stenosis (50% or greater) or occlusion. Moderate mixed plaque of the carotid bifurcation with minimal less than 30% proximal ICA stenosis. Vertebral arteries: Codominant. No evidence of dissection, stenosis (50% or greater) or occlusion. Skeleton: Moderate cervical spondylosis with discogenic degenerative changes greatest at the C5-C7 levels and C2-3 level. Multilevel facet arthrosis. Uncovertebral and facet hypertrophy encroaches on the neural foramen at the right C4-C7 levels and left C2-T1 levels.  No high-grade bony canal stenosis. Other neck: Negative. Upper chest: Negative. Review of the MIP images confirms the above findings CTA HEAD FINDINGS Anterior circulation: No significant stenosis, proximal occlusion, aneurysm, or vascular malformation. Mild non stenotic calcific atherosclerosis of the carotid siphons. Posterior circulation: No significant stenosis, proximal occlusion, aneurysm, or vascular malformation. Venous sinuses: As permitted by contrast timing, patent. Anatomic variants: Anterior communicating artery and right posterior  communicating artery. No left posterior communicating artery identified, likely hypoplastic or absent. Delayed phase: No abnormal intracranial enhancement. Review of the MIP images confirms the above findings IMPRESSION: 1. Patent carotid and vertebral arteries. No dissection, aneurysm, or hemodynamically significant stenosis utilizing NASCET criteria. 2. Patent anterior and posterior intracranial circulation. No large vessel occlusion, aneurysm, or significant stenosis. 3. Moderate atherosclerosis of the aorta and carotid bifurcations. 4. Moderate cervical spondylosis with multilevel bony foraminal stenosis. No high-grade bony canal stenosis. Electronically Signed   By: Kristine Garbe M.D.   On: 04/06/2017 00:56   Dg Chest 2 View  Result Date: 04/05/2017 CLINICAL DATA:  TIA EXAM: CHEST - 2 VIEW COMPARISON:  None. FINDINGS: The heart size and mediastinal contours are within normal limits. Both lungs are clear. The visualized skeletal structures are unremarkable. Aortic atherosclerosis. IMPRESSION: No active cardiopulmonary disease. Electronically Signed   By: Donavan Foil M.D.   On: 04/05/2017 22:49   Ct Head Wo Contrast  Result Date: 04/05/2017 CLINICAL DATA:  Transient confusion/altered mental status EXAM: CT HEAD WITHOUT CONTRAST TECHNIQUE: Contiguous axial images were obtained from the base of the skull through the vertex without intravenous contrast. COMPARISON:  None. FINDINGS: Brain: There is mild diffuse atrophy. There is no intracranial mass, hemorrhage, extra-axial fluid collection, or midline shift. There is patchy small vessel disease throughout the centra semiovale bilaterally. There is decreased attenuation in the periphery of the mid right cerebellum which potentially may represent a recent infarct. No other potential recent infarct evident. Foci of basal ganglia calcification are felt to represent a physiologic finding. Vascular: There is no appreciable hyperdense vessel  bilaterally. There is calcification in each carotid siphon region. There is also mild calcification in each middle cerebral artery. Skull: Bony calvarium appears intact. Sinuses/Orbits: There is mucosal thickening in several ethmoid air cells bilaterally. There is also slight mucosal thickening in the anterior right sphenoid sinus. Other paranasal sinuses are clear. Orbits appear symmetric bilaterally. Other: Mastoid air cells are clear. IMPRESSION: Atrophy with patchy supratentorial small vessel disease. Area of decreased attenuation in the periphery of the mid right cerebellum concerning for recent and possibly acute infarct. No other findings suggesting potential recent/acute infarct. No hemorrhage or mass. Foci of arterial vascular calcification noted. There are foci of paranasal sinus disease evident. Electronically Signed   By: Lowella Grip III M.D.   On: 04/05/2017 18:32   Ct Angio Neck W Or Wo Contrast  Result Date: 04/06/2017 CLINICAL DATA:  82 y/o  M; TIA, initial exam. EXAM: CT ANGIOGRAPHY HEAD AND NECK TECHNIQUE: Multidetector CT imaging of the head and neck was performed using the standard protocol during bolus administration of intravenous contrast. Multiplanar CT image reconstructions and MIPs were obtained to evaluate the vascular anatomy. Carotid stenosis measurements (when applicable) are obtained utilizing NASCET criteria, using the distal internal carotid diameter as the denominator. CONTRAST:  64mL ISOVUE-370 IOPAMIDOL (ISOVUE-370) INJECTION 76% COMPARISON:  04/05/2017 CT head FINDINGS: CTA NECK FINDINGS Aortic arch: Standard branching. Imaged portion shows no evidence of aneurysm or dissection. No significant stenosis of the major arch vessel origins. Moderate  calcific atherosclerosis. Right carotid system: No evidence of dissection, stenosis (50% or greater) or occlusion. Moderate mixed plaque of the carotid bifurcation with minimal less than 30% proximal ICA stenosis. Left carotid  system: No evidence of dissection, stenosis (50% or greater) or occlusion. Moderate mixed plaque of the carotid bifurcation with minimal less than 30% proximal ICA stenosis. Vertebral arteries: Codominant. No evidence of dissection, stenosis (50% or greater) or occlusion. Skeleton: Moderate cervical spondylosis with discogenic degenerative changes greatest at the C5-C7 levels and C2-3 level. Multilevel facet arthrosis. Uncovertebral and facet hypertrophy encroaches on the neural foramen at the right C4-C7 levels and left C2-T1 levels. No high-grade bony canal stenosis. Other neck: Negative. Upper chest: Negative. Review of the MIP images confirms the above findings CTA HEAD FINDINGS Anterior circulation: No significant stenosis, proximal occlusion, aneurysm, or vascular malformation. Mild non stenotic calcific atherosclerosis of the carotid siphons. Posterior circulation: No significant stenosis, proximal occlusion, aneurysm, or vascular malformation. Venous sinuses: As permitted by contrast timing, patent. Anatomic variants: Anterior communicating artery and right posterior communicating artery. No left posterior communicating artery identified, likely hypoplastic or absent. Delayed phase: No abnormal intracranial enhancement. Review of the MIP images confirms the above findings IMPRESSION: 1. Patent carotid and vertebral arteries. No dissection, aneurysm, or hemodynamically significant stenosis utilizing NASCET criteria. 2. Patent anterior and posterior intracranial circulation. No large vessel occlusion, aneurysm, or significant stenosis. 3. Moderate atherosclerosis of the aorta and carotid bifurcations. 4. Moderate cervical spondylosis with multilevel bony foraminal stenosis. No high-grade bony canal stenosis. Electronically Signed   By: Kristine Garbe M.D.   On: 04/06/2017 00:56   Mr Brain Wo Contrast  Result Date: 04/06/2017 CLINICAL DATA:  82 y/o  M; TIA, initial exam. EXAM: MRI HEAD WITHOUT  CONTRAST TECHNIQUE: Multiplanar, multiecho pulse sequences of the brain and surrounding structures were obtained without intravenous contrast. COMPARISON:  04/05/2017 CT head and 04/06/2017 CTA head. FINDINGS: Brain: No acute infarction, hemorrhage, hydrocephalus, extra-axial collection or mass lesion. Severalnonspecific foci of T2 FLAIR hyperintense signal abnormality in subcortical and periventricular white matter are compatible withmildchronic microvascular ischemic changes for age. Mildbrain parenchymal volume loss. Vascular: Normal flow voids. Skull and upper cervical spine: Normal marrow signal. Sinuses/Orbits: Mild paranasal sinus mucosal thickening. No abnormal signal of mastoid air cells. Bilateral intra-ocular lens replacement. Other: None. IMPRESSION: 1. No acute intracranial abnormality identified. 2. Mild for age chronic microvascular ischemic changes and parenchymal volume loss of the brain. 3. Mild paranasal sinus disease. Electronically Signed   By: Kristine Garbe M.D.   On: 04/06/2017 01:49   Carotid Doppler   There is 1-39% bilateral ICA stenosis. Vertebral artery flow is antegrade.   2D Echocardiogram  pending    PHYSICAL EXAM Patient alert and oriented x 3. Speech clear. No aphasia. No dysarthria. Extraoccular movements intact. Visual fields full. Face symmetric. Tongue midline. Moves all extremities x 4. Strength normal. Coordination normal. Sensation intact. Heart rate regular. Breath sounds clear.    ASSESSMENT/PLAN [name] is a 82 y.o. male with history of giant cell arteritis, anxiety, major depression and prostate cancer, but no stroke risk factors presenting with expressive and receptive aphasia.  Possible L brain TIA  CT head Small vessel disease. Atrophy.  Area of decreased attenuation mid R cerebellum. foci of paranasal sinus disease   CTA no LVO. Moderate atherosclerosis of the aorta and carotid bifurcations. Moderate cervical spondylosis  with multilevel bony foraminal stenosis.  MRI head no acute stroke  Carotid Doppler  B ICA 1-39% stenosis,  VAs antegrade   2D Echo  pending   Given palpitations, consider repeat 30 day cardiac monitoring  LDL 111  HgbA1c 5.7  Ambulatory independently - for VTE prophylaxis  Fall precautions  Diet regular Room service appropriate? Yes; Fluid consistency: Thin  Diet - low sodium heart healthy No antithrombotic prior to admission, now on aspirin 325 mg daily and clopidogrel 75 mg daily. Given TIA recommend DAPT x 3 weeks, then aspirin alone.   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  Ongoing risk factor control by Primary Care Physician  DAPT x 3 wks then asa alone  Statin  Given palpitations, consider repeat 30 d cardiac monitoring  Stroke Service will sign off. Please call should any needs arise.  Follow-up Stroke Clinic at [facility] in 4 weeks, order placed.  Hospital day # 0  Burnetta Sabin, MSN, APRN, ANVP-BC, AGPCNP-BC Advanced Practice Stroke Nurse [place] for Schedule & Pager information 04/06/2017 2:52 PM   ATTENDING NOTE: I reviewed above note and agree with the  assessment and plan. I have made any additions or clarifications directly to the above note. Pt was seen and examined.   82 year old male with history of depression on Paxil, anxiety, tachycardia, prostate cancer admitted for one episode of aphasia, and dyslexia.  Symptom lasted about 10-15 minutes and the back to baseline.  CTA head concerning for right cerebellar infarct however MRI no acute findings, no cerebellar infarct.  CTA head and neck unremarkable.  EF 60-65%.  Carotid Doppler unremarkable.  LDL 111 and A1c 5.7.  Patient stroke concerning for cortical TIA.  Patient does have a history of tachycardia in the past, as per patient, he had heart monitoring in [place] many years ago with 2 weeks of monitoring, but no abnormality found.  Given the history of tachycardia, recommend 30-day Cardiac event monitor as outpatient to rule out A. fib.  If that was negative, consider loop recorder for long-term cardiac monitoring.  Patient denies history of migraine, however he does have intermittent headache, he thought that it was hunger headache or sinus headache.  He did have a similar headache at the time of aphasia and dyslexia.  Therefore, there is low suspicious of complicated migraine.  Patient neuro examination normal.  Will recommend dual antiplatelet with aspirin and Plavix for 3 weeks and then Plavix alone.  Continue Lipitor for stroke prevention and high LDL.  Neurology will sign off. Please call with questions. Pt will follow up with stroke clinic NP at [facility] in about 4 weeks. Thanks for the consult.   Rosalin Hawking, MD PhD Stroke Neurology 04/06/2017 3:47 PM  To contact Stroke Continuity provider, please refer to [email or web address] After hours, contact General Neurology

## 2017-04-06 NOTE — Evaluation (Signed)
Speech Language Pathology Evaluation Patient Details Name: KYLIN DUBS MRN: [id number] DOB: 27-May-1935 Today's Date: 04/06/2017 Time: 7782-4235 SLP Time Calculation (min) (ACUTE ONLY): 25 min  Problem List:  Patient Active Problem List   Diagnosis Date Noted  . TIA (transient ischemic attack) 04/05/2017  . Malignant neoplasm of prostate ([place]) 03/12/2014   Past Medical History:  Past Medical History:  Diagnosis Date  . Cardiac murmur   . Hx of anxiety disorder   . Hx of major depression   . Prostate cancer [facility])    Past Surgical History:  Past Surgical History:  Procedure Laterality Date  . HERNIA REPAIR    . PROSTATE BIOPSY     HPI:  82 y.o. male presenting with aphasia. PMH is significant for adenocarcinoma prostate, giant cell arteritis, diverticulosis.  MRI negative for acute infarct.     Assessment / Plan / Recommendation Clinical Impression  Pt presents with normal speech, language, and cognition with no residual deficits.  Reviewed BE-FAST acronym to identify future signs/symptoms of potential stroke.  SLP services will sign off.     SLP Assessment  SLP Recommendation/Assessment: Patient does not need any further Speech Lanaguage Pathology Services    Follow Up Recommendations       Frequency and Duration           SLP Evaluation Cognition  Overall Cognitive Status: Within Functional Limits for tasks assessed Orientation Level: Oriented X4       Comprehension  Auditory Comprehension Overall Auditory Comprehension: Appears within functional limits for tasks assessed Yes/No Questions: Within Functional Limits Visual Recognition/Discrimination Discrimination: Within Function Limits Reading Comprehension Reading Status: Within funtional limits    Expression Expression Primary Mode of Expression: Verbal Verbal Expression Overall Verbal Expression: Appears within functional limits for tasks assessed   Oral / Motor  Oral Motor/Sensory  Function Overall Oral Motor/Sensory Function: Within functional limits Motor Speech Overall Motor Speech: Appears within functional limits for tasks assessed   GO                    Juan Quam Laurice 04/06/2017, 11:45 AM

## 2017-04-11 DIAGNOSIS — G459 Transient cerebral ischemic attack, unspecified: Secondary | ICD-10-CM | POA: Diagnosis not present

## 2017-04-12 DIAGNOSIS — Z8546 Personal history of malignant neoplasm of prostate: Secondary | ICD-10-CM | POA: Diagnosis not present

## 2017-04-19 DIAGNOSIS — Z8546 Personal history of malignant neoplasm of prostate: Secondary | ICD-10-CM | POA: Diagnosis not present

## 2017-05-01 DIAGNOSIS — R000 Tachycardia, unspecified: Secondary | ICD-10-CM | POA: Diagnosis not present

## 2017-05-10 ENCOUNTER — Encounter: Payer: Self-pay | Admitting: Adult Health

## 2017-05-10 ENCOUNTER — Ambulatory Visit (INDEPENDENT_AMBULATORY_CARE_PROVIDER_SITE_OTHER): Payer: Medicare Other | Admitting: Adult Health

## 2017-05-10 VITALS — BP 109/62 | HR 69 | Ht 71.0 in | Wt 160.6 lb

## 2017-05-10 DIAGNOSIS — G459 Transient cerebral ischemic attack, unspecified: Secondary | ICD-10-CM

## 2017-05-10 DIAGNOSIS — I471 Supraventricular tachycardia: Secondary | ICD-10-CM | POA: Diagnosis not present

## 2017-05-10 DIAGNOSIS — I499 Cardiac arrhythmia, unspecified: Secondary | ICD-10-CM | POA: Diagnosis not present

## 2017-05-10 DIAGNOSIS — E785 Hyperlipidemia, unspecified: Secondary | ICD-10-CM

## 2017-05-10 DIAGNOSIS — R008 Other abnormalities of heart beat: Secondary | ICD-10-CM | POA: Diagnosis not present

## 2017-05-10 MED ORDER — ATORVASTATIN CALCIUM 80 MG PO TABS: 80.0000 mg | ORAL_TABLET | Freq: Every day | ORAL | 3 refills | Status: AC

## 2017-05-10 NOTE — Patient Instructions (Signed)
Continue aspirin 325 mg daily  and lipitor  for secondary stroke prevention  Continue to follow up with PCP regarding cholesterol management and speak with him regarding heart monitor  Continue to eat healthy and stay active  Maintain strict control of hypertension with blood pressure goal below 130/90, diabetes with hemoglobin A1c goal below 6.5% and cholesterol with LDL cholesterol (bad cholesterol) goal below 70 mg/dL. I also advised the patient to eat a healthy diet with plenty of whole grains, cereals, fruits and vegetables, exercise regularly and maintain ideal body weight.  Followup in the future with me in 6 months

## 2017-05-10 NOTE — Progress Notes (Signed)
[facility] [address] [place]. [place] [address] [phone]       OFFICE FOLLOW UP NOTE  [name] Date of Birth:  02-15-1935 Medical Record Number:  [id number]   Reason for Referral:  hospital TIA follow up  CHIEF COMPLAINT:  Chief Complaint  Patient presents with  . Follow-up    TIA follow up, seen by Dr. Erlinda Hong in the hospital     HPI: Jon Hughes is being seen today for initial visit in the office for TIA on 04/05/17. History obtained from husband, wife, and chart review. Reviewed all radiology images and labs personally.  Jon Hughes is a 82 year old male with history of depression on Paxil, anxiety, tachycardia, prostate cancer admitted for one episode of aphasia, and dyslexia.  Symptoms lasted about 10-15 minutes and the back to baseline.  CTA head concerning for left cerebellar infarct however MRI brain reviewed and showed no acute findings, or cerebellar infarct.  CTA head and neck unremarkable.  EF 60-65%.  Carotid Doppler unremarkable.  LDL 111 and A1c 5.7. Patient stroke concerning for cortical TIA.  Patient does have a history of tachycardia in the past, as per patient, he had heart monitoring in [place] many years ago with 2 weeks of monitoring, but no abnormality found.  Given the history of tachycardia, it was recommended by Dr. Erlinda Hong for patient to repeat 30-day Cardiac event monitor as outpatient to rule out A. fib.  If that was negative, possibly consider loop recorder for long-term cardiac monitoring. Patient denies history of migraine, however he does have intermittent headaches. He did have a similar headache at the time of aphasia and dyslexia.  Therefore, there is low suspicion of complicated migraine. Patient neuro examination was normal during admission. Patient was not previously on antithrombotic PTA and it was recommend dual antiplatelet with aspirin and Plavix for 3 weeks and then aspirin alone. Start Lipitor 80 for stroke  prevention and high LDL. Patient discharged home in stable condition without therapy needs.   Since discharge, patient has been doing well. Continues to take lipitor without side effects of myalgias. Continues to take aspirin with side effects of mild bruising but not bleeding. Used plavix for a total of [redacted] weeks along with aspirin and stopped taking plavix due to running out. Advised patient to take aspirin only at this time. Blood pressure satisfactory at 109/62. Patient eats vegan diet and continues to stay active. Patient recently increased Paxil from 33m to 257mdue to increased agitation. This was patients previous dose but self decreased from 2083mo 33m74me does have current prescription for Paxil 20mg23m feels as though this increase has helped decrease agitation. Recommended by Dr. Xu duErlinda Hongng admission to repeat 30 day cardiac monitor. Patient will be following up with PCP today and wants to speak with PCP regarding this first. We will request copy of office visit [facility]. Denies new or worsening stroke/TIA symptoms.   ROS:   14 system review of systems performed and negative with exception of runny nose, eye redness, slight incontience of bowels, frequency of urination, neck pain, bruise easily, agitation  PMH:  Past Medical History:  Diagnosis Date  . Cardiac murmur   . Hx of anxiety disorder   . Hx of major depression   . Prostate cancer (HCC) [place] Stroke (HCC)[facility] PSH:  Past Surgical History:  Procedure Laterality Date  . HERNIA REPAIR    .  PROSTATE BIOPSY      Social History:  Social History   Socioeconomic History  . Marital status: Divorced    Spouse name: Not on file  . Number of children: 2  . Years of education: Not on file  . Highest education level: Not on file  Occupational History  . Occupation: Doctor, hospital  . Financial resource strain: Not on file  . Food insecurity:    Worry: Not on file     Inability: Not on file  . Transportation needs:    Medical: Not on file    Non-medical: Not on file  Tobacco Use  . Smoking status: Former Smoker    Packs/day: 1.00    Years: 8.00    Pack years: 8.00    Types: Cigarettes  . Smokeless tobacco: Never Used  Substance and Sexual Activity  . Alcohol use: No    Alcohol/week: 0.0 oz  . Drug use: No  . Sexual activity: Not on file  Lifestyle  . Physical activity:    Days per week: Not on file    Minutes per session: Not on file  . Stress: Not on file  Relationships  . Social connections:    Talks on phone: Not on file    Gets together: Not on file    Attends religious service: Not on file    Active member of club or organization: Not on file    Attends meetings of clubs or organizations: Not on file    Relationship status: Not on file  . Intimate partner violence:    Fear of current or ex partner: Not on file    Emotionally abused: Not on file    Physically abused: Not on file    Forced sexual activity: Not on file  Other Topics Concern  . Not on file  Social History Narrative  . Not on file    Family History:  Family History  Problem Relation Age of Onset  . Liver cancer Mother   . Breast cancer Sister   . Multiple myeloma Sister   . Throat cancer Brother     Medications:   Current Outpatient Medications on File Prior to Visit  Medication Sig Dispense Refill  . aspirin 325 MG tablet Take 1 tablet (325 mg total) by mouth daily. 30 tablet 0  . atorvastatin (LIPITOR) 80 MG tablet Take 1 tablet (80 mg total) by mouth daily at 6 PM. 30 tablet 0  . clopidogrel (PLAVIX) 75 MG tablet Take 75 mg by mouth daily.    Marland Kitchen GLUCOSAMINE HCL PO Take 2,000 mg by mouth 2 (two) times daily.    Marland Kitchen OVER THE COUNTER MEDICATION Take 5 mLs by mouth daily. OPC-3 anti oxidant    . OVER THE COUNTER MEDICATION     . PARoxetine (PAXIL) 20 MG tablet Take by mouth.    . tamsulosin (FLOMAX) 0.4 MG CAPS capsule Take 0.4 mg by mouth.    Marland Kitchen UNABLE TO  FIND b complex powder take daily     No current facility-administered medications on file prior to visit.     Allergies:   Allergies  Allergen Reactions  . Codeine Nausea Only  . 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 No  exam data present  General: well developed, pleasant elderly caucasian male, well nourished, seated, in no evident distress Head: head normocephalic and atraumatic.   Neck: supple with no carotid or supraclavicular bruits Cardiovascular: regular rate and rhythm, no murmurs Musculoskeletal: no deformity Skin:  no rash/petichiae Vascular:  Normal pulses all extremities  Neurologic Exam Mental Status: Awake and fully alert. Oriented to place and time. Recent and remote memory intact. Attention span, concentration and fund of knowledge appropriate. Mood and affect appropriate.  Cranial Nerves: Fundoscopic exam reveals sharp disc margins. Pupils equal, briskly reactive to light. Extraocular movements full without nystagmus. Visual fields full to confrontation. Hearing intact. Facial sensation intact. Face, tongue, palate moves normally and symmetrically.  Motor: Normal bulk and tone. Normal strength in all tested extremity muscles. Sensory.: intact to touch , pinprick , position and vibratory sensation.  Coordination: Rapid alternating movements normal in all extremities. Finger-to-nose and heel-to-shin performed accurately bilaterally. Gait and Station: Arises from chair without difficulty. Stance is normal. Gait demonstrates normal stride length and balance . Able to heel, toe and tandem walk without difficulty.  Reflexes: 1+ and symmetric. Toes downgoing.    NIHSS  0 Modified Rankin  0   Diagnostic Data (Labs, Imaging, Testing)  CT HEAD WO  CONTRAST 04/05/17 IMPRESSION: Atrophy with patchy supratentorial small vessel disease. Area of decreased attenuation in the periphery of the mid right cerebellum concerning for recent and possibly acute infarct. No other findings suggesting potential recent/acute infarct. No hemorrhage or mass. Foci of arterial vascular calcification noted. There are foci of paranasal sinus disease evident.  CT ANGIO NECK W OR WO CONTRAST CT ANGIO HEAD W OR WO CONTRAST 04/06/17 IMPRESSION: 1. Patent carotid and vertebral arteries. No dissection, aneurysm, or hemodynamically significant stenosis utilizing NASCET criteria. 2. Patent anterior and posterior intracranial circulation. No large vessel occlusion, aneurysm, or significant stenosis. 3. Moderate atherosclerosis of the aorta and carotid bifurcations. 4. Moderate cervical spondylosis with multilevel bony foraminal stenosis. No high-grade bony canal stenosis.  MR BRAIN WO CONTRAST 04/06/17 IMPRESSION: 1. No acute intracranial abnormality identified. 2. Mild for age chronic microvascular ischemic changes and parenchymal volume loss of the brain. 3. Mild paranasal sinus disease.  ECHOCARDIOGRAM COMPLETE 04/06/17 Impressions: - LVEF 60-65%, mild LVH, normal wall motion, grade 1 DD, normal LV   filling pressure, mild MR, normal LA size, mild TR, mild PI, RVSP   30 mmHg, normal IVC.  US CAROTID DUPLEX BILATERAL 04/06/17 Final Interpretation: Right Carotid: Velocities in the right ICA are consistent with a 1-39% stenosis. Left Carotid: Velocities in the left ICA are consistent with a 1-39% stenosis. Vertebrals: Bilateral vertebral arteries demonstrate antegrade flow. Subclavians: Normal flow hemodynamics were seen in bilateral subclavian arteries.    ASSESSMENT: Jon Hughes is a 82 y.o. year old male here with TIA on 04/05/17. Vascular risk factors include HLD.     PLAN: -Continue aspirin 325 mg daily  and lipitor  for secondary  stroke prevention -F/u with PCP regarding your HLD management - PCP continue to prescribe - did place refill order for Lipitor but PCP can refill in the future -patient would like to have PCPs opinion on heart monitor and will have PCP place order if needed -continue to monitor BP at home -advised to continue to eat healthy and stay active -Maintain strict control of hypertension with blood pressure goal below 130/90, diabetes with hemoglobin A1c goal below 6.5% and cholesterol with LDL cholesterol (bad cholesterol) goal below 70 mg/dL. I also advised the patient  to eat a healthy diet with plenty of whole grains, cereals, fruits and vegetables, exercise regularly and maintain ideal body weight.  Follow up in 6 months or call earlier if needed  Greater than 50% time during this 25 minute consultation visit was spent on counseling and coordination of care about HLD, discussion about risk benefit of anticoagulation and answering questions.   Venancio Poisson, AGNP-BC  [facility] [address] [place], [place] [address]  Phone [phone] Fax [phone]

## 2017-05-11 NOTE — Progress Notes (Signed)
I reviewed above note and agree with the assessment and plan. Due to cortical TIA and hx of palpitation, we continue to recommend 30 day cardiac event monitoring 30 days to rule out afib. If negative, consider loop recorder.  Or, straight to loop recorder and skip 30 day monitoring. Thanks.   Rosalin Hawking, MD PhD Stroke Neurology 05/11/2017 6:03 PM

## 2017-05-16 ENCOUNTER — Other Ambulatory Visit: Payer: Self-pay | Admitting: Neurology

## 2017-05-16 DIAGNOSIS — G459 Transient cerebral ischemic attack, unspecified: Secondary | ICD-10-CM

## 2017-05-16 NOTE — Progress Notes (Signed)
Pt 30 day cardiac event monitoring negative. Recommend loop recorder given the cortical TIA episode with hx of palpitation. Thanks.   Rosalin Hawking, MD PhD Stroke Neurology 05/16/2017 2:44 PM  Orders Placed This Encounter  Procedures  . Ambulatory referral to Cardiac Electrophysiology    Referral Priority:   Routine    Referral Type:   Consultation    Referral Reason:   Specialty Services Required    Referred to Provider:   Thompson Grayer, MD    Requested Specialty:   Cardiology    Number of Visits Requested:   1

## 2017-05-16 NOTE — Progress Notes (Signed)
Thank you :)

## 2017-05-31 ENCOUNTER — Encounter: Payer: Self-pay | Admitting: Adult Health

## 2017-05-31 NOTE — Progress Notes (Signed)
Received paperwork from [name] regarding 14 day cardiac monitor. Per result note:  patient had a min HR of 47bpm, max HR of 218 bpm, and avg HR of 73 bpm. Predominant underlying rhythm was sinus rhythm. 2 ventricular tachycardia runs occurred, the run with the fastest interval lasting 6 beats with a max rate of 179 bpm; the run with the fastest interval was also the longest. 72 supraventricular tachycardia runs occurred, the run with the fastest interval lasting 6 beats with a max rate of 218 bpm, the longest lasting 19 beats with an avg rate of 97 bpm. Supraventricular tachycardia was detected within +/- 45 seconds of symptomatic patient events. Isolated SVEs were rare, SVE Couplets were rare, and SVE Triplets were rare. Isolated VEs were occasional 1.8%, VE Couplets were rare, and VE triplets were rare. Ventricular bigeminy and trigeminy were present. Negative for atrial fibrillation.

## 2017-06-05 ENCOUNTER — Encounter: Payer: Self-pay | Admitting: Internal Medicine

## 2017-06-05 ENCOUNTER — Ambulatory Visit (INDEPENDENT_AMBULATORY_CARE_PROVIDER_SITE_OTHER): Payer: Medicare Other | Admitting: Internal Medicine

## 2017-06-05 VITALS — BP 112/74 | HR 66 | Ht 71.0 in | Wt 161.4 lb

## 2017-06-05 DIAGNOSIS — G459 Transient cerebral ischemic attack, unspecified: Secondary | ICD-10-CM | POA: Diagnosis not present

## 2017-06-05 NOTE — Patient Instructions (Signed)
Medication Instructions:  Your physician recommends that you continue on your current medications as directed. Please refer to the Current Medication list given to you today.  Labwork: None ordered.  Testing/Procedures:  Please arrive at the [facility] main entrance of [facility] at: 7:30am on 6/5 You may eat and drink as normal prior to procedure  Follow-Up: Your physician recommends that you schedule a follow-up appointment in:   10-14 days after 6/5 for a wound check  As Needed with Dr Caryl Comes.   Any Other Special Instructions Will Be Listed Below (If Applicable).     If you need a refill on your cardiac medications before your next appointment, please call your pharmacy.

## 2017-06-05 NOTE — Progress Notes (Signed)
ELECTROPHYSIOLOGY CONSULT NOTE  Patient ID: Jon Hughes MRN: [id number], DOB/AGE: 08/22/1935   Admit date: (Not on file) Date of Consult: 06/05/2017  Primary Physician: Josetta Huddle, MD Primary Cardiologist: new Reason for Consultation: Cryptogenic stroke; recommendations regarding Implantable Loop Recorder  History of Present Illness  EP has been asked to evaluate Jon Hughes for placement of an implantable loop recorder to monitor for atrial fibrillation by Dr Erlinda Hong.  The patient was admitted 3/19 for .  They first developed symptoms while performing [name].  Imaging demonstrated no acute stroke   he has undergone workup for stroke including echocardiogram and carotid dopplers.  The patient has been monitored on telemetry which has demonstrated sinus rhythm with no arrhythmias.  Inpatient stroke work-up is to be completed with a TEE.   Echocardiogram this admission demonstrated  Normal LV function normal LA size   Lab work is reviewed.  The patient denies shortness of breath, dizziness, palpitations, or syncope.  They are recovering from their stroke with plans for 30 days monitoring  at discharge.  He has had intermittent chest tightness.  It is unrelated to exertion.  He is able to walk 3 miles in 45 minutes  Past Medical History:  Diagnosis Date  . Cardiac murmur   . History of tachycardia   . Hx of anxiety disorder   . Hx of major depression   . Hyperlipidemia   . Malignant neoplasm of prostate ([place]) 03/12/2014  . Prostate cancer ([place])   . Stroke ([place])   . TIA (transient ischemic attack) 04/05/2017     Surgical History:  Past Surgical History:  Procedure Laterality Date  . HERNIA REPAIR    . PROSTATE BIOPSY        (Not in a hospital admission)  Inpatient Medications:   Allergies:  Allergies  Allergen Reactions  . Codeine Nausea Only  . 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  Marital status: Divorced    Spouse name: Not on file  . Number of children: 2  . Years of education: Not on file  . Highest education level: Not on file  Occupational History  . Occupation: Doctor, hospital  . Financial resource strain: Not on file  . Food insecurity:    Worry: Not on file    Inability: Not on file  . Transportation needs:    Medical: Not on file    Non-medical: Not on file  Tobacco Use  . Smoking status: Former Smoker    Packs/day: 1.00    Years: 8.00    Pack years: 8.00    Types: Cigarettes  . Smokeless tobacco: Never Used  Substance and Sexual Activity  . Alcohol use: No    Alcohol/week: 0.0 oz  . Drug use: No  . Sexual activity: Not on file  Lifestyle  . Physical activity:    Days per week: Not on file    Minutes per session: Not on file  . Stress: Not on file  Relationships  . Social connections:    Talks on phone: Not on file    Gets together: Not on file    Attends religious service: Not on file    Active member of club or organization: Not on file    Attends meetings of clubs or organizations: Not on file    Relationship status: Not on file  . Intimate partner violence:  Fear of current or ex partner: Not on file    Emotionally abused: Not on file    Physically abused: Not on file    Forced sexual activity: Not on file  Other Topics Concern  . Not on file  Social History Narrative  . Not on file     Family History  Problem Relation Age of Onset  . Liver cancer Mother   . Breast cancer Sister   . Multiple myeloma Sister   . Throat cancer Brother       Review of Systems: All other systems reviewed and are otherwise negative except as noted above.  Physical Exam: Vitals:   06/05/17 1534  BP: 112/74  Pulse: 66  SpO2: 96%  Weight: 161 lb 6.4 oz (73.2 kg)  Height: _0  (1.803 m)    GEN- The patient is well appearing, alert and oriented x 3 today.   Head- normocephalic, atraumatic Eyes-  Sclera clear, conjunctiva  pink Ears- hearing intact Oropharynx- clear Neck- supple Lungs- Clear to ausculation bilaterally, normal work of breathing Heart- Regular rate and rhythm, no murmurs, rubs or gallops  GI- soft, NT, ND, + BS Extremities- no clubbing, cyanosis, or edema MS- no significant deformity or atrophy Skin- no rash or lesion Psych- euthymic mood, full affect   Labs:   Lab Results  Component Value Date   WBC 5.8 04/06/2017   HGB 12.9 (L) 04/06/2017   HCT 40.0 04/06/2017   MCV 95.5 04/06/2017   PLT 216 04/06/2017   No results for input(s): NA, K, CL, CO2, BUN, CREATININE, CALCIUM, PROT, BILITOT, ALKPHOS, ALT, AST, GLUCOSE in the last 168 hours.  Invalid input(s): LABALBU   Radiology/Studies: No results found.  12-lead ECG *sinus @ 66 14/09/80 * (personally reviewed) All prior EKG's in EPIC reviewed with no documented atrial fibrillation  Telemetry no afib (personally reviewed)  Outpatient event recorder was reviewed demonstrating nonsustained atrial tachycardia and nonsustained ventricular tachycardia  Assessment and Plan:  1. Cryptogenic storke  The patient had a TIA.  Dr. Erlinda Hong is concerned about possible thromboembolic event.  Without a definite source, it is reasonable to proceed with loop recorder insertion.  We reviewed risks and benefits.    He is currently taking aspirin 325; we will decrease it to 81.    2-nonsustained ventricular tachycardia.  In the context of normal LV function we will check electrolytes but otherwise would not pursue this further     Wound care was reviewed with the patient (keep incision clean and dry for 3 days).  Wound check scheduled and entered in AVS.  Please call with questions.   [name], MD 06/05/2017 6:03 PM

## 2017-06-05 NOTE — H&P (View-Only) (Signed)
  ELECTROPHYSIOLOGY CONSULT NOTE  Patient ID: Jon Hughes MRN: [id number], DOB/AGE: 08/30/1935   Admit date: (Not on file) Date of Consult: 06/05/2017  Primary Physician: Gates, Saige, MD Primary Cardiologist: new Reason for Consultation: Cryptogenic stroke; recommendations regarding Implantable Loop Recorder  History of Present Illness  EP has been asked to evaluate Jon Hughes for placement of an implantable loop recorder to monitor for atrial fibrillation by Dr Xu.  The patient was admitted 3/19 for .  They first developed symptoms while performing [place].  Imaging demonstrated no acute stroke   he has undergone workup for stroke including echocardiogram and carotid dopplers.  The patient has been monitored on telemetry which has demonstrated sinus rhythm with no arrhythmias.  Inpatient stroke work-up is to be completed with a TEE.   Echocardiogram this admission demonstrated  Normal LV function normal LA size   Lab work is reviewed.  The patient denies shortness of breath, dizziness, palpitations, or syncope.  They are recovering from their stroke with plans for 30 days monitoring  at discharge.  He has had intermittent chest tightness.  It is unrelated to exertion.  He is able to walk 3 miles in 45 minutes  Past Medical History:  Diagnosis Date  . Cardiac murmur   . History of tachycardia   . Hx of anxiety disorder   . Hx of major depression   . Hyperlipidemia   . Malignant neoplasm of prostate (HCC) 03/12/2014  . Prostate cancer (HCC)   . Stroke (HCC)   . TIA (transient ischemic attack) 04/05/2017     Surgical History:  Past Surgical History:  Procedure Laterality Date  . HERNIA REPAIR    . PROSTATE BIOPSY        (Not in a hospital admission)  Inpatient Medications:   Allergies:  Allergies  Allergen Reactions  . Codeine Nausea Only  . 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  Marital status: Divorced    Spouse name: Not on file  . Number of children: 2  . Years of education: Not on file  . Highest education level: Not on file  Occupational History  . Occupation: Marketing  Social Needs  . Financial resource strain: Not on file  . Food insecurity:    Worry: Not on file    Inability: Not on file  . Transportation needs:    Medical: Not on file    Non-medical: Not on file  Tobacco Use  . Smoking status: Former Smoker    Packs/day: 1.00    Years: 8.00    Pack years: 8.00    Types: Cigarettes  . Smokeless tobacco: Never Used  Substance and Sexual Activity  . Alcohol use: No    Alcohol/week: 0.0 oz  . Drug use: No  . Sexual activity: Not on file  Lifestyle  . Physical activity:    Days per week: Not on file    Minutes per session: Not on file  . Stress: Not on file  Relationships  . Social connections:    Talks on phone: Not on file    Gets together: Not on file    Attends religious service: Not on file    Active member of club or organization: Not on file    Attends meetings of clubs or organizations: Not on file    Relationship status: Not on file  . Intimate partner violence:      Fear of current or ex partner: Not on file    Emotionally abused: Not on file    Physically abused: Not on file    Forced sexual activity: Not on file  Other Topics Concern  . Not on file  Social History Narrative  . Not on file     Family History  Problem Relation Age of Onset  . Liver cancer Mother   . Breast cancer Sister   . Multiple myeloma Sister   . Throat cancer Brother       Review of Systems: All other systems reviewed and are otherwise negative except as noted above.  Physical Exam: Vitals:   06/05/17 1534  BP: 112/74  Pulse: 66  SpO2: 96%  Weight: 161 lb 6.4 oz (73.2 kg)  Height: 5' 11" (1.803 m)    GEN- The patient is well appearing, alert and oriented x 3 today.   Head- normocephalic, atraumatic Eyes-  Sclera clear, conjunctiva  pink Ears- hearing intact Oropharynx- clear Neck- supple Lungs- Clear to ausculation bilaterally, normal work of breathing Heart- Regular rate and rhythm, no murmurs, rubs or gallops  GI- soft, NT, ND, + BS Extremities- no clubbing, cyanosis, or edema MS- no significant deformity or atrophy Skin- no rash or lesion Psych- euthymic mood, full affect   Labs:   Lab Results  Component Value Date   WBC 5.8 04/06/2017   HGB 12.9 (L) 04/06/2017   HCT 40.0 04/06/2017   MCV 95.5 04/06/2017   PLT 216 04/06/2017   No results for input(s): NA, K, CL, CO2, BUN, CREATININE, CALCIUM, PROT, BILITOT, ALKPHOS, ALT, AST, GLUCOSE in the last 168 hours.  Invalid input(s): LABALBU   Radiology/Studies: No results found.  12-lead ECG *sinus @ 66 14/09/41 * (personally reviewed) All prior EKG's in EPIC reviewed with no documented atrial fibrillation  Telemetry no afib (personally reviewed)  Outpatient event recorder was reviewed demonstrating nonsustained atrial tachycardia and nonsustained ventricular tachycardia  Assessment and Plan:  1. Cryptogenic storke  The patient had a TIA.  Dr. Xu is concerned about possible thromboembolic event.  Without a definite source, it is reasonable to proceed with loop recorder insertion.  We reviewed risks and benefits.    He is currently taking aspirin 325; we will decrease it to 81.    2-nonsustained ventricular tachycardia.  In the context of normal LV function we will check electrolytes but otherwise would not pursue this further     Wound care was reviewed with the patient (keep incision clean and dry for 3 days).  Wound check scheduled and entered in AVS.  Please call with questions.   Steven Klein, MD 06/05/2017 6:03 PM   

## 2017-06-13 ENCOUNTER — Encounter (HOSPITAL_COMMUNITY): Payer: Self-pay | Admitting: Internal Medicine

## 2017-06-13 ENCOUNTER — Encounter (HOSPITAL_COMMUNITY)
Admission: RE | Disposition: A | Discharge status: Home / Self Care | Payer: Self-pay | Source: Ambulatory Visit | Attending: Internal Medicine

## 2017-06-13 ENCOUNTER — Ambulatory Visit (HOSPITAL_COMMUNITY)
Admission: RE | Admit: 2017-06-13 | Discharge: 2017-06-13 | Disposition: A | Discharge status: Home / Self Care | Payer: Medicare Other | Source: Ambulatory Visit | Attending: Internal Medicine | Admitting: Internal Medicine

## 2017-06-13 DIAGNOSIS — I472 Ventricular tachycardia: Secondary | ICD-10-CM | POA: Diagnosis not present

## 2017-06-13 DIAGNOSIS — E785 Hyperlipidemia, unspecified: Secondary | ICD-10-CM | POA: Diagnosis not present

## 2017-06-13 DIAGNOSIS — Z882 Allergy status to sulfonamides status: Secondary | ICD-10-CM | POA: Diagnosis not present

## 2017-06-13 DIAGNOSIS — I6389 Other cerebral infarction: Secondary | ICD-10-CM | POA: Diagnosis not present

## 2017-06-13 DIAGNOSIS — Z807 Family history of other malignant neoplasms of lymphoid, hematopoietic and related tissues: Secondary | ICD-10-CM | POA: Diagnosis not present

## 2017-06-13 DIAGNOSIS — Z800 Family history of malignant neoplasm of digestive organs: Secondary | ICD-10-CM | POA: Insufficient documentation

## 2017-06-13 DIAGNOSIS — Z803 Family history of malignant neoplasm of breast: Secondary | ICD-10-CM | POA: Insufficient documentation

## 2017-06-13 DIAGNOSIS — G459 Transient cerebral ischemic attack, unspecified: Secondary | ICD-10-CM | POA: Diagnosis present

## 2017-06-13 DIAGNOSIS — Z885 Allergy status to narcotic agent status: Secondary | ICD-10-CM | POA: Diagnosis not present

## 2017-06-13 DIAGNOSIS — I639 Cerebral infarction, unspecified: Secondary | ICD-10-CM | POA: Diagnosis not present

## 2017-06-13 DIAGNOSIS — Z8673 Personal history of transient ischemic attack (TIA), and cerebral infarction without residual deficits: Secondary | ICD-10-CM | POA: Insufficient documentation

## 2017-06-13 DIAGNOSIS — Z9889 Other specified postprocedural states: Secondary | ICD-10-CM | POA: Insufficient documentation

## 2017-06-13 DIAGNOSIS — Z87891 Personal history of nicotine dependence: Secondary | ICD-10-CM | POA: Insufficient documentation

## 2017-06-13 DIAGNOSIS — Z7982 Long term (current) use of aspirin: Secondary | ICD-10-CM | POA: Insufficient documentation

## 2017-06-13 DIAGNOSIS — Z8546 Personal history of malignant neoplasm of prostate: Secondary | ICD-10-CM | POA: Diagnosis not present

## 2017-06-13 DIAGNOSIS — Z808 Family history of malignant neoplasm of other organs or systems: Secondary | ICD-10-CM | POA: Diagnosis not present

## 2017-06-13 HISTORY — PX: LOOP RECORDER INSERTION: EP1214

## 2017-06-13 SURGERY — LOOP RECORDER INSERTION

## 2017-06-13 MED ORDER — LIDOCAINE-EPINEPHRINE 1 %-1:100000 IJ SOLN
INTRAMUSCULAR | Status: AC
  Filled 2017-06-13: qty 1

## 2017-06-13 MED ORDER — LIDOCAINE-EPINEPHRINE 1 %-1:100000 IJ SOLN
INTRAMUSCULAR | Status: DC | PRN
  Administered 2017-06-13: 20 mL

## 2017-06-13 SURGICAL SUPPLY — 2 items
LOOP REVEAL LINQSYS (Prosthesis & Implant Heart) ×2 IMPLANT
PACK LOOP INSERTION (CUSTOM PROCEDURE TRAY) ×3 IMPLANT

## 2017-06-13 NOTE — Discharge Instructions (Signed)
See extra loop recorder implant sheet.

## 2017-06-13 NOTE — Interval H&P Note (Signed)
History and Physical Interval Note:  06/13/2017 7:51 AM  Jon Hughes  has presented today for surgery, with the diagnosis of tia  The various methods of treatment have been discussed with the patient and family. After consideration of risks, benefits and other options for treatment, the patient has consented to  Procedure(s): LOOP RECORDER INSERTION (N/A) as a surgical intervention .  The patient's history has been reviewed, patient examined, no change in status, stable for surgery.  I have reviewed the patient's chart and labs.  Questions were answered to the patient's satisfaction.     [name]

## 2017-06-25 ENCOUNTER — Ambulatory Visit (INDEPENDENT_AMBULATORY_CARE_PROVIDER_SITE_OTHER): Payer: Self-pay | Admitting: *Deleted

## 2017-06-25 DIAGNOSIS — G459 Transient cerebral ischemic attack, unspecified: Secondary | ICD-10-CM

## 2017-06-25 LAB — CUP PACEART INCLINIC DEVICE CHECK
Implantable Pulse Generator Implant Date: 20190605
MDC IDC SESS DTM: 20190617173203

## 2017-06-25 NOTE — Progress Notes (Signed)
Wound check in clinic s/p ILR implant. Steri strips removed. Wound well healed without redness or edema. Incision edges approximated. Normal device function. Battery status: GOOD. R-waves 0.88mV. 0 symptom episodes, 0 tachy episodes, 0 pause episodes, 0 brady episodes. 0 AF episodes (0% burden). Patient education completed including wound care, remote monitoring, and routine follow up. Monthly summary reports and ROV with SK in 12 months.

## 2017-07-16 ENCOUNTER — Ambulatory Visit (INDEPENDENT_AMBULATORY_CARE_PROVIDER_SITE_OTHER): Payer: Medicare Other | Admitting: *Deleted

## 2017-07-16 DIAGNOSIS — I639 Cerebral infarction, unspecified: Secondary | ICD-10-CM | POA: Diagnosis not present

## 2017-07-16 NOTE — Progress Notes (Signed)
Carelink Summary Report / Loop Recorder 

## 2017-08-13 DIAGNOSIS — L570 Actinic keratosis: Secondary | ICD-10-CM | POA: Diagnosis not present

## 2017-08-13 DIAGNOSIS — L821 Other seborrheic keratosis: Secondary | ICD-10-CM | POA: Diagnosis not present

## 2017-08-13 DIAGNOSIS — D1801 Hemangioma of skin and subcutaneous tissue: Secondary | ICD-10-CM | POA: Diagnosis not present

## 2017-08-13 DIAGNOSIS — D224 Melanocytic nevi of scalp and neck: Secondary | ICD-10-CM | POA: Diagnosis not present

## 2017-08-13 DIAGNOSIS — D225 Melanocytic nevi of trunk: Secondary | ICD-10-CM | POA: Diagnosis not present

## 2017-08-13 DIAGNOSIS — D0471 Carcinoma in situ of skin of right lower limb, including hip: Secondary | ICD-10-CM | POA: Diagnosis not present

## 2017-08-13 DIAGNOSIS — Z85828 Personal history of other malignant neoplasm of skin: Secondary | ICD-10-CM | POA: Diagnosis not present

## 2017-08-13 DIAGNOSIS — L814 Other melanin hyperpigmentation: Secondary | ICD-10-CM | POA: Diagnosis not present

## 2017-08-20 ENCOUNTER — Ambulatory Visit (INDEPENDENT_AMBULATORY_CARE_PROVIDER_SITE_OTHER): Payer: Medicare Other | Admitting: *Deleted

## 2017-08-20 DIAGNOSIS — I639 Cerebral infarction, unspecified: Secondary | ICD-10-CM

## 2017-08-20 NOTE — Progress Notes (Signed)
Carelink Summary Report / Loop Recorder 

## 2017-08-23 LAB — CUP PACEART REMOTE DEVICE CHECK
Date Time Interrogation Session: 20190708141041
Implantable Pulse Generator Implant Date: 20190605

## 2017-09-20 ENCOUNTER — Ambulatory Visit (INDEPENDENT_AMBULATORY_CARE_PROVIDER_SITE_OTHER): Payer: Medicare Other | Admitting: *Deleted

## 2017-09-20 DIAGNOSIS — I639 Cerebral infarction, unspecified: Secondary | ICD-10-CM | POA: Diagnosis not present

## 2017-09-20 NOTE — Progress Notes (Signed)
Carelink Summary Report / Loop Recorder 

## 2017-09-27 LAB — CUP PACEART REMOTE DEVICE CHECK
Date Time Interrogation Session: 20190810143917
Implantable Pulse Generator Implant Date: 20190605

## 2017-10-05 LAB — CUP PACEART REMOTE DEVICE CHECK
Date Time Interrogation Session: 20190912160952
MDC IDC PG IMPLANT DT: 20190605

## 2017-10-09 DIAGNOSIS — Z86010 Personal history of colonic polyps: Secondary | ICD-10-CM | POA: Diagnosis not present

## 2017-10-09 DIAGNOSIS — Z8546 Personal history of malignant neoplasm of prostate: Secondary | ICD-10-CM | POA: Diagnosis not present

## 2017-10-09 DIAGNOSIS — R1012 Left upper quadrant pain: Secondary | ICD-10-CM | POA: Diagnosis not present

## 2017-10-16 DIAGNOSIS — Z23 Encounter for immunization: Secondary | ICD-10-CM | POA: Diagnosis not present

## 2017-10-16 DIAGNOSIS — Z8546 Personal history of malignant neoplasm of prostate: Secondary | ICD-10-CM | POA: Diagnosis not present

## 2017-10-16 DIAGNOSIS — H9193 Unspecified hearing loss, bilateral: Secondary | ICD-10-CM | POA: Diagnosis not present

## 2017-10-16 DIAGNOSIS — N5201 Erectile dysfunction due to arterial insufficiency: Secondary | ICD-10-CM | POA: Diagnosis not present

## 2017-10-23 ENCOUNTER — Ambulatory Visit (INDEPENDENT_AMBULATORY_CARE_PROVIDER_SITE_OTHER): Payer: Medicare Other | Admitting: *Deleted

## 2017-10-23 DIAGNOSIS — I639 Cerebral infarction, unspecified: Secondary | ICD-10-CM | POA: Diagnosis not present

## 2017-10-24 NOTE — Progress Notes (Signed)
Carelink Summary Report / Loop Recorder 

## 2017-11-05 LAB — CUP PACEART REMOTE DEVICE CHECK
MDC IDC PG IMPLANT DT: 20190605
MDC IDC SESS DTM: 20191015160640

## 2017-11-07 DIAGNOSIS — H35372 Puckering of macula, left eye: Secondary | ICD-10-CM | POA: Diagnosis not present

## 2017-11-21 ENCOUNTER — Other Ambulatory Visit: Payer: Self-pay | Admitting: Gastroenterology

## 2017-11-21 DIAGNOSIS — Q438 Other specified congenital malformations of intestine: Secondary | ICD-10-CM | POA: Diagnosis not present

## 2017-11-21 DIAGNOSIS — K5730 Diverticulosis of large intestine without perforation or abscess without bleeding: Secondary | ICD-10-CM | POA: Diagnosis not present

## 2017-11-21 DIAGNOSIS — Z86010 Personal history of colonic polyps: Secondary | ICD-10-CM

## 2017-11-21 DIAGNOSIS — K56699 Other intestinal obstruction unspecified as to partial versus complete obstruction: Secondary | ICD-10-CM

## 2017-11-22 ENCOUNTER — Ambulatory Visit
Admission: RE | Admit: 2017-11-22 | Discharge: 2017-11-22 | Disposition: A | Discharge status: Home / Self Care | Payer: Medicare Other | Source: Ambulatory Visit | Attending: Gastroenterology | Admitting: Gastroenterology

## 2017-11-22 DIAGNOSIS — K5730 Diverticulosis of large intestine without perforation or abscess without bleeding: Secondary | ICD-10-CM | POA: Diagnosis not present

## 2017-11-22 DIAGNOSIS — K56699 Other intestinal obstruction unspecified as to partial versus complete obstruction: Secondary | ICD-10-CM

## 2017-11-22 DIAGNOSIS — Z860100 Personal history of colon polyps, unspecified: Secondary | ICD-10-CM

## 2017-11-23 ENCOUNTER — Telehealth: Payer: Self-pay | Admitting: Adult Health

## 2017-11-23 NOTE — Telephone Encounter (Signed)
Patient mentioned that he feels no need to come because he feels fine. He stated that he has a heart monitor and he hasn't had any new issues. He stated that he has being followed by his PCP since his loop recorder has been placed.   Per [name] it is recommended that the patient is followed up every 6 months to make sure everything is going smoothly. Since the patient is being monitored by his PCP it is ok to cancel his appointment. If the patient has any questions to call our office at anytime.  Jon Hughes verbalized understanding the instructions given and was very appreciative. He stated he would call our office if he has any questions in the future. His appointment has been cancelled. Patient is aware.

## 2017-11-23 NOTE — Telephone Encounter (Signed)
Pt is asking for a call to discuss if this 6 mo f/u is really needed

## 2017-11-26 ENCOUNTER — Ambulatory Visit (INDEPENDENT_AMBULATORY_CARE_PROVIDER_SITE_OTHER): Payer: Medicare Other

## 2017-11-26 DIAGNOSIS — I639 Cerebral infarction, unspecified: Secondary | ICD-10-CM

## 2017-11-26 NOTE — Progress Notes (Signed)
Carelink Summary Report / Loop Recorder 

## 2017-11-27 ENCOUNTER — Ambulatory Visit: Payer: Medicare Other | Admitting: Adult Health

## 2017-11-28 DIAGNOSIS — E785 Hyperlipidemia, unspecified: Secondary | ICD-10-CM | POA: Diagnosis not present

## 2017-12-04 ENCOUNTER — Other Ambulatory Visit: Payer: Self-pay | Admitting: Gastroenterology

## 2017-12-04 DIAGNOSIS — R933 Abnormal findings on diagnostic imaging of other parts of digestive tract: Secondary | ICD-10-CM

## 2017-12-04 DIAGNOSIS — R1012 Left upper quadrant pain: Secondary | ICD-10-CM

## 2017-12-10 ENCOUNTER — Ambulatory Visit
Admission: RE | Admit: 2017-12-10 | Discharge: 2017-12-10 | Disposition: A | Discharge status: Home / Self Care | Payer: Medicare Other | Source: Ambulatory Visit | Attending: Gastroenterology | Admitting: Gastroenterology

## 2017-12-10 DIAGNOSIS — R1012 Left upper quadrant pain: Secondary | ICD-10-CM

## 2017-12-10 DIAGNOSIS — K5730 Diverticulosis of large intestine without perforation or abscess without bleeding: Secondary | ICD-10-CM | POA: Diagnosis not present

## 2017-12-10 DIAGNOSIS — R933 Abnormal findings on diagnostic imaging of other parts of digestive tract: Secondary | ICD-10-CM

## 2017-12-10 MED ORDER — IOPAMIDOL (ISOVUE-300) INJECTION 61%
100.0000 mL | Freq: Once | INTRAVENOUS | Status: AC | PRN
  Administered 2017-12-10: 100 mL via INTRAVENOUS

## 2017-12-12 ENCOUNTER — Ambulatory Visit (INDEPENDENT_AMBULATORY_CARE_PROVIDER_SITE_OTHER): Payer: Self-pay

## 2017-12-12 ENCOUNTER — Ambulatory Visit (INDEPENDENT_AMBULATORY_CARE_PROVIDER_SITE_OTHER): Payer: Medicare Other | Admitting: Orthopaedic Surgery

## 2017-12-12 ENCOUNTER — Encounter (INDEPENDENT_AMBULATORY_CARE_PROVIDER_SITE_OTHER): Payer: Self-pay | Admitting: Orthopaedic Surgery

## 2017-12-12 DIAGNOSIS — G8929 Other chronic pain: Secondary | ICD-10-CM | POA: Diagnosis not present

## 2017-12-12 DIAGNOSIS — I639 Cerebral infarction, unspecified: Secondary | ICD-10-CM

## 2017-12-12 DIAGNOSIS — M25561 Pain in right knee: Secondary | ICD-10-CM | POA: Diagnosis not present

## 2017-12-12 MED ORDER — METHYLPREDNISOLONE ACETATE 40 MG/ML IJ SUSP
40.0000 mg | INTRAMUSCULAR | Status: AC | PRN
  Administered 2017-12-12: 40 mg via INTRA_ARTICULAR

## 2017-12-12 MED ORDER — LIDOCAINE HCL 1 % IJ SOLN
3.0000 mL | INTRAMUSCULAR | Status: AC | PRN
  Administered 2017-12-12: 3 mL

## 2017-12-12 NOTE — Progress Notes (Signed)
Office Visit Note   Patient: Jon Hughes           Date of Birth: Dec 23, 1935           MRN: [id number] Visit Date: 12/12/2017              Requested by: Josetta Huddle, MD [address] [place], [place] [address] PCP: Josetta Huddle, MD   Assessment & Plan: Visit Diagnoses:  1. Chronic pain of right knee     Plan: He really has a young appearing knee on clinical exam and x-ray.  If he continues to have the locking catching I would recommend an MRI to rule out a meniscal tear.  Feel that he likely has some type of a component of a chronic on acute meniscal tear based on his symptomology.  I did recommend a steroid injection at least to see if this would help with his knee overall.  I would like to reevaluate him in a month to see if he still having mechanical symptoms or not.  If that ends up being the case an MRI would be warranted.  All question concerns were answered and addressed.  We will see him back in 4 weeks.  Follow-Up Instructions: Return in about 4 weeks (around 01/09/2018).   Orders:  Orders Placed This Encounter  Procedures  . Splinting  . Large Joint Inj  . XR Knee 1-2 Views Right   No orders of the defined types were placed in this encounter.     Procedures: Large Joint Inj: R knee on 12/12/2017 2:39 PM Indications: diagnostic evaluation and pain Details: 22 G 1.5 in needle, superolateral approach  Arthrogram: No  Medications: 3 mL lidocaine 1 %; 40 mg methylPREDNISolone acetate 40 MG/ML Outcome: tolerated well, no immediate complications Procedure, treatment alternatives, risks and benefits explained, specific risks discussed. Consent was given by the patient. Immediately prior to procedure a time out was called to verify the correct patient, procedure, equipment, support staff and site/side marked as required. Patient was prepped and draped in the usual sterile fashion.       Clinical Data: No additional findings.   Subjective: Chief  Complaint  Patient presents with  . Right Knee - Pain  The patient is very avid runner who is been having some knee pain with locking and catching that comes and goes for last 20 years or more.  At times he has no knee issues at all but at times he pivots his knee and gets a significant pain in the medial aspect of his knee.  That has happened recently but now is coming down again.  He denies any other significant injuries to that knee but again does get locking and catching.  He occasionally gets an effusion.  HPI  Review of Systems He is not a diabetic.  He denies any headache, chest pain, shortness of breath, fever, chills, nausea, vomiting.  Objective: Vital Signs: There were no vitals taken for this visit.  Physical Exam He is alert orient x3 and in no acute distress Ortho Exam Examination of his right knee today shows full range of motion.  The knee is ligamentously stable.  His Lockman's exam and McMurray's exam is normal. Specialty Comments:  No specialty comments available.  Imaging: Xr Knee 1-2 Views Right  Result Date: 12/12/2017 2 views of the right knee show no acute findings.  The joint space medially and laterally appears well-maintained.  There is patellofemoral arthritic changes.  PMFS History: Patient Active Problem List   Diagnosis Date Noted  . Hyperlipidemia   . History of tachycardia   . TIA (transient ischemic attack) 04/05/2017  . Malignant neoplasm of prostate ([place]) 03/12/2014   Past Medical History:  Diagnosis Date  . Cardiac murmur   . History of tachycardia   . Hx of anxiety disorder   . Hx of major depression   . Hyperlipidemia   . Malignant neoplasm of prostate ([facility]) 03/12/2014  . Prostate cancer ([facility])   . Stroke ([place])   . TIA (transient ischemic attack) 04/05/2017    Family History  Problem Relation Age of Onset  . Liver cancer Mother   . Breast cancer Sister   . Multiple myeloma Sister   . Throat cancer Brother     Past Surgical  History:  Procedure Laterality Date  . HERNIA REPAIR    . LOOP RECORDER INSERTION N/A 06/13/2017   Procedure: LOOP RECORDER INSERTION;  Surgeon: Deboraha Sprang, MD;  Location: [facility];  Service: Cardiovascular;  Laterality: N/A;  . PROSTATE BIOPSY     Social History   Occupational History  . Occupation: Pharmacologist  Tobacco Use  . Smoking status: Former Smoker    Packs/day: 1.00    Years: 8.00    Pack years: 8.00    Types: Cigarettes  . Smokeless tobacco: Never Used  Substance and Sexual Activity  . Alcohol use: No    Alcohol/week: 0.0 standard drinks  . Drug use: No  . Sexual activity: Not on file

## 2017-12-14 ENCOUNTER — Ambulatory Visit (INDEPENDENT_AMBULATORY_CARE_PROVIDER_SITE_OTHER): Payer: Medicare Other | Admitting: Internal Medicine

## 2017-12-14 ENCOUNTER — Encounter: Payer: Self-pay | Admitting: Internal Medicine

## 2017-12-14 VITALS — BP 120/80 | HR 76 | Ht 71.0 in | Wt 162.8 lb

## 2017-12-14 DIAGNOSIS — L820 Inflamed seborrheic keratosis: Secondary | ICD-10-CM | POA: Diagnosis not present

## 2017-12-14 DIAGNOSIS — I639 Cerebral infarction, unspecified: Secondary | ICD-10-CM

## 2017-12-14 DIAGNOSIS — G459 Transient cerebral ischemic attack, unspecified: Secondary | ICD-10-CM

## 2017-12-14 DIAGNOSIS — L814 Other melanin hyperpigmentation: Secondary | ICD-10-CM | POA: Diagnosis not present

## 2017-12-14 DIAGNOSIS — L821 Other seborrheic keratosis: Secondary | ICD-10-CM | POA: Diagnosis not present

## 2017-12-14 DIAGNOSIS — Z85828 Personal history of other malignant neoplasm of skin: Secondary | ICD-10-CM | POA: Diagnosis not present

## 2017-12-14 DIAGNOSIS — D1801 Hemangioma of skin and subcutaneous tissue: Secondary | ICD-10-CM | POA: Diagnosis not present

## 2017-12-14 DIAGNOSIS — L570 Actinic keratosis: Secondary | ICD-10-CM | POA: Diagnosis not present

## 2017-12-14 DIAGNOSIS — C44622 Squamous cell carcinoma of skin of right upper limb, including shoulder: Secondary | ICD-10-CM | POA: Diagnosis not present

## 2017-12-14 LAB — CUP PACEART INCLINIC DEVICE CHECK
Implantable Pulse Generator Implant Date: 20190605
MDC IDC SESS DTM: 20191206121832

## 2017-12-14 NOTE — Patient Instructions (Signed)
Medication Instructions:  Your physician recommends that you continue on your current medications as directed. Please refer to the Current Medication list given to you today.  Labwork: None ordered.  Testing/Procedures: None ordered.  Follow-Up: Your physician recommends that you schedule a follow-up appointment as needed with Dr Klein  Any Other Special Instructions Will Be Listed Below (If Applicable).     If you need a refill on your cardiac medications before your next appointment, please call your pharmacy.  

## 2017-12-14 NOTE — Progress Notes (Signed)
      Patient Care Team: Josetta Huddle, MD as PCP - General (Internal Medicine)   HPI  Jon Hughes is a 82 y.o. male Seen in followup for loop recorder implanted for Cryptogenic Stroke   Taking ASA 81  DATE TEST EF   3/19 Echo   60-65 %         Questions regarding the role of the monitor and statin therapy  The patient denies chest pain, shortness of breath, nocturnal dyspnea, orthopnea or peripheral edema.  There have been no palpitations, lightheadedness or syncope.    Records and Results Reviewed  Past Medical History:  Diagnosis Date  . Cardiac murmur   . History of tachycardia   . Hx of anxiety disorder   . Hx of major depression   . Hyperlipidemia   . Malignant neoplasm of prostate ([facility]) 03/12/2014  . Prostate cancer ([facility])   . Stroke ([facility])   . TIA (transient ischemic attack) 04/05/2017    Past Surgical History:  Procedure Laterality Date  . HERNIA REPAIR    . LOOP RECORDER INSERTION N/A 06/13/2017   Procedure: LOOP RECORDER INSERTION;  Surgeon: Deboraha Sprang, MD;  Location: [facility];  Service: Cardiovascular;  Laterality: N/A;  . PROSTATE BIOPSY      Current Meds  Medication Sig  . aspirin EC 81 MG tablet Take 81 mg by mouth daily.  Marland Kitchen atorvastatin (LIPITOR) 80 MG tablet Take 1 tablet (80 mg total) by mouth daily at 6 PM.  . b complex vitamins capsule Take 1 capsule by mouth daily.  Marland Kitchen ibuprofen (ADVIL,MOTRIN) 200 MG tablet Take 200-400 mg by mouth daily as needed for headache or moderate pain.  Marland Kitchen OVER THE COUNTER MEDICATION Take 1 Dose by mouth daily. OPC-3 anti oxidant   . PARoxetine (PAXIL) 20 MG tablet Take 20 mg by mouth daily.   . Psyllium (METAMUCIL PO) Take 1 Dose by mouth daily.  . tamsulosin (FLOMAX) 0.4 MG CAPS capsule Take 0.4 mg by mouth daily.     Allergies  Allergen Reactions  . Codeine Nausea Only  . 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 Cryptogenic storke   Reviewed indication for SPARCL   Discussed CRYSTAL AF implications and relations to anticoagulation v antiplatlet therapy   We spent more than 50% of our >25 min visit in face to face counseling regarding the above    Current medicines are reviewed at length with the patient today .  The patient does not  have concerns regarding medicines.

## 2017-12-19 ENCOUNTER — Telehealth (INDEPENDENT_AMBULATORY_CARE_PROVIDER_SITE_OTHER): Payer: Self-pay | Admitting: Orthopaedic Surgery

## 2017-12-19 ENCOUNTER — Other Ambulatory Visit (INDEPENDENT_AMBULATORY_CARE_PROVIDER_SITE_OTHER): Payer: Self-pay

## 2017-12-19 DIAGNOSIS — G8929 Other chronic pain: Secondary | ICD-10-CM

## 2017-12-19 DIAGNOSIS — M25561 Pain in right knee: Principal | ICD-10-CM

## 2017-12-19 NOTE — Telephone Encounter (Signed)
Patient stated that on his appt on 12/12/17 stated the inj did not work and he wants to proceed with MRI as discussed with him and Ninfa Linden.  Please call patient to advise

## 2017-12-19 NOTE — Telephone Encounter (Signed)
Sent order for MRI 

## 2017-12-28 ENCOUNTER — Ambulatory Visit (INDEPENDENT_AMBULATORY_CARE_PROVIDER_SITE_OTHER): Payer: Medicare Other

## 2017-12-28 DIAGNOSIS — I639 Cerebral infarction, unspecified: Secondary | ICD-10-CM | POA: Diagnosis not present

## 2017-12-28 NOTE — Progress Notes (Signed)
Carelink Summary Report / Loop Recorder 

## 2017-12-29 ENCOUNTER — Ambulatory Visit
Admission: RE | Admit: 2017-12-29 | Discharge: 2017-12-29 | Disposition: A | Discharge status: Home / Self Care | Payer: Medicare Other | Source: Ambulatory Visit | Attending: Orthopaedic Surgery | Admitting: Orthopaedic Surgery

## 2017-12-29 DIAGNOSIS — M23321 Other meniscus derangements, posterior horn of medial meniscus, right knee: Secondary | ICD-10-CM | POA: Diagnosis not present

## 2017-12-29 DIAGNOSIS — G8929 Other chronic pain: Secondary | ICD-10-CM

## 2017-12-29 DIAGNOSIS — M25561 Pain in right knee: Principal | ICD-10-CM

## 2017-12-31 ENCOUNTER — Ambulatory Visit (INDEPENDENT_AMBULATORY_CARE_PROVIDER_SITE_OTHER): Payer: Medicare Other | Admitting: Orthopaedic Surgery

## 2017-12-31 ENCOUNTER — Encounter (INDEPENDENT_AMBULATORY_CARE_PROVIDER_SITE_OTHER): Payer: Self-pay | Admitting: Orthopaedic Surgery

## 2017-12-31 DIAGNOSIS — S83241A Other tear of medial meniscus, current injury, right knee, initial encounter: Secondary | ICD-10-CM | POA: Diagnosis not present

## 2017-12-31 DIAGNOSIS — I639 Cerebral infarction, unspecified: Secondary | ICD-10-CM | POA: Diagnosis not present

## 2017-12-31 NOTE — Progress Notes (Signed)
The patient is here to go over an MRI of his right knee.  He is a very avid runner.  We obtained an MRI because he was having medial joint line tenderness in that knee with locking catching and his plain films showed well-maintained joint space.  He had an intra-articular injection which temporize symptoms for only a short amount of time.  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  On exam he only has medial joint line tenderness of his right knee with a positive Murray sign to the medial compartment.  There is no effusion his range of motion is full.  The MRI does show a significant tear of the posterior horn mid body of the medial meniscus with a flap component.  There is no full-thickness cartilage loss in his knee and is only mild to moderate arthritic changes with some thinning of the articular cartilage.  The collateral ligaments as well as the ACL PCL and lateral meniscus and compartment are pristine and intact.  Given his continued mechanical symptoms and the MRI findings he is a good candidate for arthroscopic intervention.  I showed him a knee model and went over in detail what surgery involves with knee arthroscopy.  I explained the risk and benefits and the goals of surgery.  We talked about nonoperative treatment measures as well and I talked to him about continue to watch his knee but he does not like that the knee hurts the way it does  its detrimentally affecting his activities daily living and his quality of life to the point that he does wish to proceed with a knee arthroscopy.  I do feel this appropriate at this point given his signs and symptoms and continue findings as well as the MRI findings.  We will work on getting this scheduled in the near future.  All question concerns were answered and addressed.

## 2018-01-03 ENCOUNTER — Other Ambulatory Visit (INDEPENDENT_AMBULATORY_CARE_PROVIDER_SITE_OTHER): Payer: Self-pay | Admitting: Orthopaedic Surgery

## 2018-01-03 DIAGNOSIS — S83231D Complex tear of medial meniscus, current injury, right knee, subsequent encounter: Secondary | ICD-10-CM

## 2018-01-03 DIAGNOSIS — X58XXXA Exposure to other specified factors, initial encounter: Secondary | ICD-10-CM | POA: Diagnosis not present

## 2018-01-03 DIAGNOSIS — M94261 Chondromalacia, right knee: Secondary | ICD-10-CM | POA: Diagnosis not present

## 2018-01-03 DIAGNOSIS — Y999 Unspecified external cause status: Secondary | ICD-10-CM | POA: Diagnosis not present

## 2018-01-03 DIAGNOSIS — S83231A Complex tear of medial meniscus, current injury, right knee, initial encounter: Secondary | ICD-10-CM | POA: Diagnosis not present

## 2018-01-03 DIAGNOSIS — G8918 Other acute postprocedural pain: Secondary | ICD-10-CM | POA: Diagnosis not present

## 2018-01-03 MED ORDER — HYDROCODONE-ACETAMINOPHEN 5-325 MG PO TABS: 1.0000 | ORAL_TABLET | Freq: Four times a day (QID) | ORAL | 0 refills | Status: DC | PRN

## 2018-01-03 MED ORDER — ONDANSETRON 4 MG PO TBDP: 4.0000 mg | ORAL_TABLET | Freq: Three times a day (TID) | ORAL | 0 refills | Status: DC | PRN

## 2018-01-07 ENCOUNTER — Ambulatory Visit (INDEPENDENT_AMBULATORY_CARE_PROVIDER_SITE_OTHER): Payer: Medicare Other | Admitting: Orthopaedic Surgery

## 2018-01-07 ENCOUNTER — Other Ambulatory Visit: Payer: Medicare Other

## 2018-01-10 ENCOUNTER — Ambulatory Visit (INDEPENDENT_AMBULATORY_CARE_PROVIDER_SITE_OTHER): Payer: Medicare Other | Admitting: Physician Assistant

## 2018-01-10 ENCOUNTER — Encounter (INDEPENDENT_AMBULATORY_CARE_PROVIDER_SITE_OTHER): Payer: Self-pay | Admitting: Physician Assistant

## 2018-01-10 DIAGNOSIS — Z9889 Other specified postprocedural states: Secondary | ICD-10-CM

## 2018-01-10 NOTE — Progress Notes (Signed)
HPI: Jon Hughes returns today status post right knee arthroscopy 01/03/2018.  States overall the knee is doing well.  He has some mild soreness.  Not taking any pain medication.  He has had no fevers chills shortness of breath or chest pain.  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  Physical exam: Right knee full extension full flexion.  Calf supple nontender.  Port sites well approximated with sutures no signs of infection.   Impression: Status post right knee arthroscopy  Plan: He will work on scar tissue mobilization.  Sutures were removed today.  Work on range of Designer, jewellery.  Follow-up on as-needed basis.

## 2018-01-13 LAB — CUP PACEART REMOTE DEVICE CHECK
Implantable Pulse Generator Implant Date: 20190605
MDC IDC SESS DTM: 20191117164210

## 2018-01-14 ENCOUNTER — Ambulatory Visit (INDEPENDENT_AMBULATORY_CARE_PROVIDER_SITE_OTHER): Payer: Self-pay | Admitting: Orthopaedic Surgery

## 2018-01-27 LAB — CUP PACEART REMOTE DEVICE CHECK
MDC IDC PG IMPLANT DT: 20190605
MDC IDC SESS DTM: 20191220174123

## 2018-01-30 ENCOUNTER — Ambulatory Visit (INDEPENDENT_AMBULATORY_CARE_PROVIDER_SITE_OTHER): Payer: Medicare Other

## 2018-01-30 DIAGNOSIS — I639 Cerebral infarction, unspecified: Secondary | ICD-10-CM

## 2018-01-31 LAB — CUP PACEART REMOTE DEVICE CHECK
Date Time Interrogation Session: 20200122173756
MDC IDC PG IMPLANT DT: 20190605

## 2018-01-31 NOTE — Progress Notes (Signed)
Carelink Summary Report / Loop Recorder 

## 2018-02-28 ENCOUNTER — Ambulatory Visit (INDEPENDENT_AMBULATORY_CARE_PROVIDER_SITE_OTHER): Payer: Medicare Other | Admitting: Orthopaedic Surgery

## 2018-02-28 DIAGNOSIS — Z9889 Other specified postprocedural states: Secondary | ICD-10-CM

## 2018-02-28 NOTE — Progress Notes (Signed)
The patient is a 83 year old gentleman who is getting close to 2 months status post a right knee arthroscopy.  He is someone with a very young knee but only had a medial meniscal tear.  He has not run yet but he does feel sometimes when he steps down on the knee but he has pain like he had before surgery.  I gave him encouragement and reassurance that this is normal for short time out from the arthroscopy.  Also given extent of the meniscal tear it does take some time to recover.  On exam there is no effusion of his right knee is all his range of motion is full.  He has no tenderness today in the knee feels ligamentously stable.  At this point I want him to hold off from running for 1 more month.  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  Follow-up at this point will be as needed however if he has any issues in the future he will let us know because my neck step would be a steroid injection followed by hyaluronic acid.  All question concerns were answered and addressed otherwise.

## 2018-03-21 ENCOUNTER — Other Ambulatory Visit: Payer: Self-pay

## 2018-03-21 ENCOUNTER — Ambulatory Visit (INDEPENDENT_AMBULATORY_CARE_PROVIDER_SITE_OTHER): Payer: Medicare Other | Admitting: Psychiatry

## 2018-03-21 ENCOUNTER — Encounter: Payer: Self-pay | Admitting: Psychiatry

## 2018-03-21 DIAGNOSIS — F411 Generalized anxiety disorder: Secondary | ICD-10-CM | POA: Diagnosis not present

## 2018-03-21 DIAGNOSIS — F4001 Agoraphobia with panic disorder: Secondary | ICD-10-CM

## 2018-03-21 DIAGNOSIS — F3342 Major depressive disorder, recurrent, in full remission: Secondary | ICD-10-CM

## 2018-03-21 MED ORDER — PAROXETINE HCL 20 MG PO TABS: 20.0000 mg | ORAL_TABLET | Freq: Every day | ORAL | 3 refills | Status: DC

## 2018-03-21 NOTE — Progress Notes (Signed)
BALDWIN RACICOT [id number] [birth year] 83 y.o.  Subjective:   Patient ID:  Jon Hughes is a 83 y.o. (DOB Mar 18, 1935) male.  Chief Complaint:  Chief Complaint  Patient presents with  . Follow-up    med mangagement  . Medication Refill    HPI seen with his wife SAIR FAULCON presents to the office today for follow-up of depression and anxiety.  His last visit was March 20, 2017.  In general he is done well with regard to depression and anxiety since then.  He did have a TIA on April 05, 2017 with extensive neurologic and cardiac work-up which did not reveal the precise cause.  There was no residual damage.  He has been given a heart monitor since that time and there have been no unusual occurrences.  Patient reports stable mood and denies depressed or irritable moods.  Patient denies any recent difficulty with anxiety.  Patient denies difficulty with sleep initiation or maintenance. Denies appetite disturbance.  Patient reports that energy and motivation have been good.  Patient denies any difficulty with concentration.  Patient denies any suicidal ideation. He and his wife continue to struggle financially month-to-month but are otherwise doing okay.  Past psychiatric meds.  He has been under my psychiatric care since 1997 for major depression and generalized anxiety disorder with panic.  Most of that time is been on paroxetine with good response.  His last significant relapse was in 2014.  He failed to respond to citalopram at that time.  Did respond to brief potentiation with Abilify on Paxil 30 mg.  Since then he has been maintained on Paxil 20 mg effectively.  He has remote history of alcohol dependence but has been sober and active in [place] for 29 years  Review of Systems:  Review of Systems  Musculoskeletal: Positive for arthralgias.  Neurological: Negative for tremors and weakness.    Medications: I have reviewed the patient's current medications.  Current Outpatient  Medications  Medication Sig Dispense Refill  . aspirin EC 81 MG tablet Take 81 mg by mouth daily.    Marland Kitchen atorvastatin (LIPITOR) 80 MG tablet Take 1 tablet (80 mg total) by mouth daily at 6 PM. 90 tablet 3  . b complex vitamins capsule Take 1 capsule by mouth daily.    Marland Kitchen PARoxetine (PAXIL) 20 MG tablet Take 1 tablet (20 mg total) by mouth daily. 90 tablet 3  . Psyllium (METAMUCIL PO) Take 1 Dose by mouth daily.    . tamsulosin (FLOMAX) 0.4 MG CAPS capsule Take 0.4 mg by mouth daily.     Marland Kitchen ibuprofen (ADVIL,MOTRIN) 200 MG tablet Take 200-400 mg by mouth daily as needed for headache or moderate pain.    Marland Kitchen ondansetron (ZOFRAN ODT) 4 MG disintegrating tablet Take 1 tablet (4 mg total) by mouth every 8 (eight) hours as needed for nausea or vomiting. (Patient not taking: Reported on 03/21/2018) 20 tablet 0  . OVER THE COUNTER MEDICATION Take 1 Dose by mouth daily. OPC-3 anti oxidant      No current facility-administered medications for this visit.     Medication Side Effects: None  Allergies:  Allergies  Allergen Reactions  . Codeine Nausea Only  . Sulfa Antibiotics Rash    while in the [employer]    Past Medical History:  Diagnosis Date  . Cardiac murmur   . History of tachycardia   . Hx of anxiety disorder   . Hx of major depression   . Hyperlipidemia   .  Malignant neoplasm of prostate (HCC) 03/12/2014  . Prostate cancer (HCC)   . Stroke (HCC)   . TIA (transient ischemic attack) 04/05/2017    Family History  Problem Relation Age of Onset  . Liver cancer Mother   . Breast cancer Sister   . Multiple myeloma Sister   . Throat cancer Brother     Social History   Socioeconomic History  . Marital status: Divorced    Spouse name: Not on file  . Number of children: 2  . Years of education: Not on file  . Highest education level: Not on file  Occupational History  . Occupation: Marketing  Social Needs  . Financial resource strain: Not on file  . Food insecurity:    Worry: Not on  file    Inability: Not on file  . Transportation needs:    Medical: Not on file    Non-medical: Not on file  Tobacco Use  . Smoking status: Former Smoker    Packs/day: 1.00    Years: 8.00    Pack years: 8.00    Types: Cigarettes  . Smokeless tobacco: Never Used  Substance and Sexual Activity  . Alcohol use: No    Alcohol/week: 0.0 standard drinks  . Drug use: No  . Sexual activity: Not on file  Lifestyle  . Physical activity:    Days per week: Not on file    Minutes per session: Not on file  . Stress: Not on file  Relationships  . Social connections:    Talks on phone: Not on file    Gets together: Not on file    Attends religious service: Not on file    Active member of club or organization: Not on file    Attends meetings of clubs or organizations: Not on file    Relationship status: Not on file  . Intimate partner violence:    Fear of current or ex partner: Not on file    Emotionally abused: Not on file    Physically abused: Not on file    Forced sexual activity: Not on file  Other Topics Concern  . Not on file  Social History Narrative  . Not on file    Past Medical History, Surgical history, Social history, and Family history were reviewed and updated as appropriate.   Please see review of systems for further details on the patient's review from today.   Objective:   Physical Exam:  There were no vitals taken for this visit.  Physical Exam Constitutional:      General: He is not in acute distress.    Appearance: He is well-developed.  Musculoskeletal:        General: No deformity.  Neurological:     Mental Status: He is alert and oriented to person, place, and time.     Coordination: Coordination normal.  Psychiatric:        Attention and Perception: Attention normal. He is attentive.        Mood and Affect: Mood normal. Mood is not anxious or depressed. Affect is not labile, blunt, angry or inappropriate.        Speech: Speech normal.         Behavior: Behavior normal.        Thought Content: Thought content normal. Thought content does not include homicidal or suicidal ideation. Thought content does not include homicidal or suicidal plan.        Cognition and Memory: Cognition normal.          Judgment: Judgment normal.     Comments: Insight is good.     Lab Review:     Component Value Date/Time   NA 138 04/06/2017 0225   K 3.6 04/06/2017 0225   CL 104 04/06/2017 0225   CO2 27 04/06/2017 0225   GLUCOSE 98 04/06/2017 0225   BUN 9 04/06/2017 0225   CREATININE 0.85 04/06/2017 0225   CALCIUM 8.9 04/06/2017 0225   PROT 6.7 04/05/2017 1615   ALBUMIN 3.8 04/05/2017 1615   AST 21 04/05/2017 1615   ALT 16 (L) 04/05/2017 1615   ALKPHOS 78 04/05/2017 1615   BILITOT 0.7 04/05/2017 1615   GFRNONAA >60 04/06/2017 0225   GFRAA >60 04/06/2017 0225       Component Value Date/Time   WBC 5.8 04/06/2017 0225   RBC 4.19 (L) 04/06/2017 0225   HGB 12.9 (L) 04/06/2017 0225   HCT 40.0 04/06/2017 0225   PLT 216 04/06/2017 0225   MCV 95.5 04/06/2017 0225   MCH 30.8 04/06/2017 0225   MCHC 32.3 04/06/2017 0225   RDW 13.6 04/06/2017 0225   LYMPHSABS 1.1 04/05/2017 1615   MONOABS 0.4 04/05/2017 1615   EOSABS 0.1 04/05/2017 1615   BASOSABS 0.0 04/05/2017 1615    No results found for: POCLITH, LITHIUM   No results found for: PHENYTOIN, PHENOBARB, VALPROATE, CBMZ   .res Assessment: Plan:    Recurrent major depression in full remission ([place])  Generalized anxiety disorder  Panic disorder with agoraphobia   Continued good response on paroxetine 20 mg a day with resolution of his pression and anxiety disorders.  He is tolerating the medications well.  No indication for change.  He  He remains sober and committed to [place]  Per his request we will follow-up 1 year.  Lynder Parents MD, DFAPA Please see After Visit Summary for patient specific instructions.  Future Appointments  Date Time Provider [place]  05/01/2018  8:25  AM [facility]    No orders of the defined types were placed in this encounter.     -------------------------------

## 2018-03-25 ENCOUNTER — Ambulatory Visit (INDEPENDENT_AMBULATORY_CARE_PROVIDER_SITE_OTHER): Payer: Medicare Other | Admitting: *Deleted

## 2018-03-25 DIAGNOSIS — I639 Cerebral infarction, unspecified: Secondary | ICD-10-CM | POA: Diagnosis not present

## 2018-03-26 ENCOUNTER — Other Ambulatory Visit: Payer: Self-pay | Admitting: Internal Medicine

## 2018-03-28 ENCOUNTER — Other Ambulatory Visit: Payer: Self-pay

## 2018-03-28 LAB — CUP PACEART REMOTE DEVICE CHECK
Date Time Interrogation Session: 20200316040500
Implantable Pulse Generator Implant Date: 20190605

## 2018-04-01 ENCOUNTER — Telehealth (INDEPENDENT_AMBULATORY_CARE_PROVIDER_SITE_OTHER): Payer: Self-pay | Admitting: Orthopaedic Surgery

## 2018-04-01 NOTE — Telephone Encounter (Signed)
See below, do you want to see him or call him?

## 2018-04-01 NOTE — Telephone Encounter (Signed)
New Message  Pt verbalized he needing advice on what to do with his knee.  Pt verbalized he's been walking and running and he's at the 90 day marker for everything to get better but pt stated it has not.  I was going to make an appt per pt but advised to send note to provider and have the MD and nurse decide if it's urgent or if it's we can handle verbally over the phone.

## 2018-04-01 NOTE — Telephone Encounter (Signed)
I did speak to the patient.  We should get him in the office therefore allowed to in the next week or so for steroid shot in his knee.

## 2018-04-01 NOTE — Progress Notes (Signed)
Carelink Summary Report / Loop Recorder 

## 2018-04-02 NOTE — Telephone Encounter (Signed)
Can we schedule him for an appt please

## 2018-04-02 NOTE — Telephone Encounter (Signed)
Patient aware of the below message  

## 2018-04-02 NOTE — Telephone Encounter (Signed)
Called patient advised he is going to hold off on the steroid injection. Patient said he did want to know if he did normal exercise and jogging will it hurt his knee. The number to contact patient is [phone]

## 2018-04-02 NOTE — Telephone Encounter (Signed)
See below

## 2018-04-02 NOTE — Telephone Encounter (Signed)
He can exercise the knee, but no jogging for at least another 4-6 weeks.

## 2018-04-26 ENCOUNTER — Other Ambulatory Visit: Payer: Self-pay

## 2018-04-26 ENCOUNTER — Ambulatory Visit (INDEPENDENT_AMBULATORY_CARE_PROVIDER_SITE_OTHER): Payer: Medicare Other | Admitting: *Deleted

## 2018-04-26 DIAGNOSIS — I639 Cerebral infarction, unspecified: Secondary | ICD-10-CM | POA: Diagnosis not present

## 2018-04-26 LAB — CUP PACEART REMOTE DEVICE CHECK
Date Time Interrogation Session: 20200417034217
Implantable Pulse Generator Implant Date: 20190605

## 2018-05-01 NOTE — Progress Notes (Signed)
Carelink Summary Report / Loop Recorder 

## 2018-05-13 ENCOUNTER — Telehealth: Payer: Self-pay | Admitting: Cardiology

## 2018-05-13 NOTE — Telephone Encounter (Signed)
Transmission received. All AF episodes false  Chanetta Marshall, NP 05/13/2018 1:21 PM

## 2018-05-13 NOTE — Telephone Encounter (Signed)
Spoke w/ pt and instructed him how to send a manual transmission w/ his home monitor. Transmission received.  

## 2018-05-29 ENCOUNTER — Ambulatory Visit (INDEPENDENT_AMBULATORY_CARE_PROVIDER_SITE_OTHER): Payer: Medicare Other | Admitting: *Deleted

## 2018-05-29 DIAGNOSIS — I639 Cerebral infarction, unspecified: Secondary | ICD-10-CM | POA: Diagnosis not present

## 2018-05-29 LAB — CUP PACEART REMOTE DEVICE CHECK
Date Time Interrogation Session: 20200520104206
Implantable Pulse Generator Implant Date: 20190605

## 2018-06-07 NOTE — Progress Notes (Signed)
Carelink Summary Report / Loop Recorder 

## 2018-07-01 ENCOUNTER — Ambulatory Visit (INDEPENDENT_AMBULATORY_CARE_PROVIDER_SITE_OTHER): Payer: Medicare Other | Admitting: *Deleted

## 2018-07-01 DIAGNOSIS — G459 Transient cerebral ischemic attack, unspecified: Secondary | ICD-10-CM

## 2018-07-02 LAB — CUP PACEART REMOTE DEVICE CHECK
Date Time Interrogation Session: 20200622134113
Implantable Pulse Generator Implant Date: 20190605

## 2018-07-09 NOTE — Progress Notes (Signed)
Carelink Summary Report / Loop Recorder 

## 2018-07-15 ENCOUNTER — Telehealth: Payer: Self-pay

## 2018-07-15 NOTE — Telephone Encounter (Signed)
Appointment 07-16-2018 at 3:30 pm  [facility]      COVID-19 Pre-Screening Questions:  . In the past 7 to 10 days have you had a cough,  shortness of breath, headache, congestion, fever (100 or greater) body aches, chills, sore throat, or sudden loss of taste or sense of smell? . Have you been around anyone with known Covid 19. . Have you been around anyone who is awaiting Covid 19 test results in the past 7 to 10 days? . Have you been around anyone who has been exposed to Covid 19, or has mentioned symptoms of Covid 19 within the past 7 to 10 days?  If you have any concerns/questions about symptoms patients report during screening (either on the phone or at threshold). Contact the provider seeing the patient or DOD for further guidance.  If neither are available contact a member of the leadership team.

## 2018-07-16 ENCOUNTER — Other Ambulatory Visit: Payer: Self-pay

## 2018-07-16 ENCOUNTER — Telehealth: Payer: Self-pay | Admitting: Psychiatry

## 2018-07-16 ENCOUNTER — Ambulatory Visit (INDEPENDENT_AMBULATORY_CARE_PROVIDER_SITE_OTHER): Payer: Medicare Other | Admitting: *Deleted

## 2018-07-16 DIAGNOSIS — G459 Transient cerebral ischemic attack, unspecified: Secondary | ICD-10-CM

## 2018-07-16 LAB — CUP PACEART INCLINIC DEVICE CHECK
Date Time Interrogation Session: 20200707161510
Implantable Pulse Generator Implant Date: 20190605

## 2018-07-16 NOTE — Progress Notes (Signed)
Loop check in clinic. Battery status: good. R-waves 0.54mV. 0 symptom episodes, 0 tachy episodes, 0 pause episodes, 0 brady episodes. 35 AF episodes (<0.1% burden). Longest episode 14 minutes. Available ECGs appears SR w/ ectopy, not true AF. AF detection reprogrammed to less sensitive, AT/AF recording threshold increased to >=89min. Monthly summary reports (next one 08/05/18 and ROV w/ Dr. Caryl Comes PRN.

## 2018-07-16 NOTE — Telephone Encounter (Signed)
Jon Hughes is a long time pt of CC. Wanted a msg on file when CC returns stating he would like a recommendation for his spouse Jon Hughes. Please call when he returns .

## 2018-07-29 NOTE — Telephone Encounter (Signed)
If she needs psychiatric help.  Recommend new psychiatric NP who's doctoral level, [employer].

## 2018-08-04 LAB — CUP PACEART REMOTE DEVICE CHECK
Date Time Interrogation Session: 20200725141208
Implantable Pulse Generator Implant Date: 20190605

## 2018-08-05 ENCOUNTER — Ambulatory Visit (INDEPENDENT_AMBULATORY_CARE_PROVIDER_SITE_OTHER): Payer: Medicare Other | Admitting: *Deleted

## 2018-08-05 DIAGNOSIS — G459 Transient cerebral ischemic attack, unspecified: Secondary | ICD-10-CM

## 2018-08-20 NOTE — Progress Notes (Signed)
Carelink Summary Report / Loop Recorder 

## 2018-09-05 ENCOUNTER — Ambulatory Visit (INDEPENDENT_AMBULATORY_CARE_PROVIDER_SITE_OTHER): Payer: Medicare Other | Admitting: *Deleted

## 2018-09-05 DIAGNOSIS — G459 Transient cerebral ischemic attack, unspecified: Secondary | ICD-10-CM

## 2018-09-05 LAB — CUP PACEART REMOTE DEVICE CHECK
Date Time Interrogation Session: 20200827133547
Implantable Pulse Generator Implant Date: 20190605

## 2018-09-11 NOTE — Progress Notes (Signed)
Carelink Summary Report / Loop Recorder 

## 2018-10-08 ENCOUNTER — Ambulatory Visit (INDEPENDENT_AMBULATORY_CARE_PROVIDER_SITE_OTHER): Payer: Medicare Other | Admitting: *Deleted

## 2018-10-08 DIAGNOSIS — G459 Transient cerebral ischemic attack, unspecified: Secondary | ICD-10-CM

## 2018-10-08 LAB — CUP PACEART REMOTE DEVICE CHECK
Date Time Interrogation Session: 20200929195136
Implantable Pulse Generator Implant Date: 20190605

## 2018-10-18 NOTE — Progress Notes (Signed)
Carelink Summary Report / Loop Recorder 

## 2018-11-10 LAB — CUP PACEART REMOTE DEVICE CHECK
Date Time Interrogation Session: 20201101181005
Implantable Pulse Generator Implant Date: 20190605

## 2018-11-11 ENCOUNTER — Ambulatory Visit (INDEPENDENT_AMBULATORY_CARE_PROVIDER_SITE_OTHER): Payer: Medicare Other | Admitting: *Deleted

## 2018-11-11 DIAGNOSIS — G459 Transient cerebral ischemic attack, unspecified: Secondary | ICD-10-CM

## 2018-12-03 NOTE — Progress Notes (Signed)
Carelink Summary Report / Loop Recorder 

## 2018-12-13 ENCOUNTER — Ambulatory Visit (INDEPENDENT_AMBULATORY_CARE_PROVIDER_SITE_OTHER): Payer: Medicare Other | Admitting: *Deleted

## 2018-12-13 DIAGNOSIS — G459 Transient cerebral ischemic attack, unspecified: Secondary | ICD-10-CM | POA: Diagnosis not present

## 2018-12-14 LAB — CUP PACEART REMOTE DEVICE CHECK
Date Time Interrogation Session: 20201204145034
Implantable Pulse Generator Implant Date: 20190605

## 2019-01-15 ENCOUNTER — Ambulatory Visit (INDEPENDENT_AMBULATORY_CARE_PROVIDER_SITE_OTHER): Payer: Medicare Other | Admitting: *Deleted

## 2019-01-15 DIAGNOSIS — G459 Transient cerebral ischemic attack, unspecified: Secondary | ICD-10-CM | POA: Diagnosis not present

## 2019-01-16 LAB — CUP PACEART REMOTE DEVICE CHECK
Date Time Interrogation Session: 20210106145151
Implantable Pulse Generator Implant Date: 20190605

## 2019-02-17 ENCOUNTER — Ambulatory Visit (INDEPENDENT_AMBULATORY_CARE_PROVIDER_SITE_OTHER): Payer: Medicare Other | Admitting: *Deleted

## 2019-02-17 DIAGNOSIS — G459 Transient cerebral ischemic attack, unspecified: Secondary | ICD-10-CM

## 2019-02-17 LAB — CUP PACEART REMOTE DEVICE CHECK
Date Time Interrogation Session: 20210208000251
Implantable Pulse Generator Implant Date: 20190605

## 2019-02-18 NOTE — Progress Notes (Signed)
ILR Remote 

## 2019-03-20 ENCOUNTER — Ambulatory Visit (INDEPENDENT_AMBULATORY_CARE_PROVIDER_SITE_OTHER): Payer: Medicare Other | Admitting: Psychiatry

## 2019-03-20 ENCOUNTER — Ambulatory Visit (INDEPENDENT_AMBULATORY_CARE_PROVIDER_SITE_OTHER): Payer: Medicare Other | Admitting: *Deleted

## 2019-03-20 ENCOUNTER — Other Ambulatory Visit: Payer: Self-pay

## 2019-03-20 ENCOUNTER — Encounter: Payer: Self-pay | Admitting: Psychiatry

## 2019-03-20 DIAGNOSIS — F3342 Major depressive disorder, recurrent, in full remission: Secondary | ICD-10-CM

## 2019-03-20 DIAGNOSIS — F411 Generalized anxiety disorder: Secondary | ICD-10-CM

## 2019-03-20 DIAGNOSIS — F4001 Agoraphobia with panic disorder: Secondary | ICD-10-CM | POA: Diagnosis not present

## 2019-03-20 DIAGNOSIS — G459 Transient cerebral ischemic attack, unspecified: Secondary | ICD-10-CM

## 2019-03-20 LAB — CUP PACEART REMOTE DEVICE CHECK
Date Time Interrogation Session: 20210311002302
Implantable Pulse Generator Implant Date: 20190605

## 2019-03-20 MED ORDER — PAROXETINE HCL 20 MG PO TABS: 20.0000 mg | ORAL_TABLET | Freq: Every day | ORAL | 3 refills | Status: DC

## 2019-03-20 NOTE — Progress Notes (Signed)
ILR Remote 

## 2019-03-20 NOTE — Progress Notes (Signed)
Jon Hughes [id number] Mar 17, 1935 84 y.o.  Subjective:   Patient ID:  Jon Hughes is a 84 y.o. (DOB Feb 26, 1935) male.  Chief Complaint:  Chief Complaint  Patient presents with  . Follow-up    Medication Management   . Depression    Medication Management     HPI  [name] presents to the office today for follow-up of depression and anxiety.  He did have a TIA on April 05, 2017 with extensive neurologic and cardiac work-up which did not reveal the precise cause.  There was no residual damage.  He has been given a heart monitor since that time and there have been no unusual occurrences.  He was doing well March 2020 at his last visit.  No meds were changed.  Still goo mood.  Covid has been good to him. For awhile he was listless and couldn't do [employer].   But lately taken more active stance. Has developed, created [employer].   Patient reports stable mood and denies depressed or irritable moods. Typically enthusiastic. Not aware of manias.   Patient denies any recent difficulty with anxiety.  Patient denies difficulty with sleep initiation or maintenance. Denies appetite disturbance.  Patient reports that energy and motivation have been good.  Patient denies any difficulty with concentration.  Patient denies any suicidal ideation. He and his wife have less struggle financially due to [employer] and  are otherwise doing okay. Sons and family are doing well.    Past psychiatric meds.  He has been under my psychiatric care since 1997 for major depression and generalized anxiety disorder with panic.  Most of that time is been on paroxetine with good response.  His last significant relapse was in 2014.  He failed to respond to citalopram at that time.  Did respond to brief potentiation with Abilify on Paxil 30 mg.  Since then he has been maintained on Paxil 20 mg effectively.  He has remote history of alcohol dependence but has been sober and active in  [place] for 29 years  Review of Systems:  Review of Systems  Musculoskeletal: Positive for arthralgias.  Neurological: Negative for dizziness, tremors and weakness.  Psychiatric/Behavioral: Positive for depression.    Medications: I have reviewed the patient's current medications.  Current Outpatient Medications  Medication Sig Dispense Refill  . aspirin EC 81 MG tablet Take 81 mg by mouth daily.    Marland Kitchen atorvastatin (LIPITOR) 80 MG tablet Take 1 tablet (80 mg total) by mouth daily at 6 PM. 90 tablet 3  . b complex vitamins capsule Take 1 capsule by mouth daily.    Marland Kitchen ibuprofen (ADVIL,MOTRIN) 200 MG tablet Take 200-400 mg by mouth daily as needed for headache or moderate pain.    Marland Kitchen OVER THE COUNTER MEDICATION Take 1 Dose by mouth daily. OPC-3 anti oxidant     . PARoxetine (PAXIL) 20 MG tablet Take 1 tablet (20 mg total) by mouth daily. 90 tablet 3  . Psyllium (METAMUCIL PO) Take 1 Dose by mouth daily.    . tamsulosin (FLOMAX) 0.4 MG CAPS capsule Take 0.4 mg by mouth daily.      No current facility-administered medications for this visit.    Medication Side Effects: None  Allergies:  Allergies  Allergen Reactions  . Codeine Nausea Only  . Sulfa Antibiotics Rash    while in the [employer]    Past Medical History:  Diagnosis Date  . Cardiac murmur   . History of tachycardia   .  Hx of anxiety disorder   . Hx of major depression   . Hyperlipidemia   . Malignant neoplasm of prostate ([place]) 03/12/2014  . Prostate cancer ([place])   . Stroke ([place])   . TIA (transient ischemic attack) 04/05/2017    Family History  Problem Relation Age of Onset  . Liver cancer Mother   . Breast cancer Sister   . Multiple myeloma Sister   . Throat cancer Brother     Social History   Socioeconomic History  . Marital status: Divorced    Spouse name: Not on file  . Number of children: 2  . Years of education: Not on file  . Highest education level: Not on file  Occupational History  . Occupation:  Pharmacologist  Tobacco Use  . Smoking status: Former Smoker    Packs/day: 1.00    Years: 8.00    Pack years: 8.00    Types: Cigarettes  . Smokeless tobacco: Never Used  Substance and Sexual Activity  . Alcohol use: No    Alcohol/week: 0.0 standard drinks  . Drug use: No  . Sexual activity: Not on file  Other Topics Concern  . Not on file  Social History Narrative  . Not on file   Social Determinants of Health   Financial Resource Strain:   . Difficulty of Paying Living Expenses:   Food Insecurity:   . Worried About Charity fundraiser in the Last Year:   . Arboriculturist in the Last Year:   Transportation Needs:   . Film/video editor (Medical):   Marland Kitchen Lack of Transportation (Non-Medical):   Physical Activity:   . Days of Exercise per Week:   . Minutes of Exercise per Session:   Stress:   . Feeling of Stress :   Social Connections:   . Frequency of Communication with Friends and Family:   . Frequency of Social Gatherings with Friends and Family:   . Attends Religious Services:   . Active Member of Clubs or Organizations:   . Attends Archivist Meetings:   Marland Kitchen Marital Status:   Intimate Partner Violence:   . Fear of Current or Ex-Partner:   . Emotionally Abused:   Marland Kitchen Physically Abused:   . Sexually Abused:     Past Medical History, Surgical history, Social history, and Family history were reviewed and updated as appropriate.   Please see review of systems for further details on the patient's review from today.   Objective:   Physical Exam:  There were no vitals taken for this visit.  Physical Exam Constitutional:      General: He is not in acute distress.    Appearance: He is well-developed.  Musculoskeletal:        General: No deformity.  Neurological:     Mental Status: He is alert and oriented to person, place, and time.     Coordination: Coordination normal.  Psychiatric:        Attention and Perception: Attention normal. He is attentive.         Mood and Affect: Mood normal. Mood is not anxious or depressed. Affect is not labile, blunt, angry, tearful or inappropriate.        Speech: Speech normal.        Behavior: Behavior normal.        Thought Content: Thought content normal. Thought content does not include homicidal or suicidal ideation. Thought content does not include homicidal or suicidal plan.  Cognition and Memory: Cognition normal.        Judgment: Judgment normal.     Comments: Insight is good. Talkative per usual.       Lab Review:     Component Value Date/Time   NA 138 04/06/2017 0225   K 3.6 04/06/2017 0225   CL 104 04/06/2017 0225   CO2 27 04/06/2017 0225   GLUCOSE 98 04/06/2017 0225   BUN 9 04/06/2017 0225   CREATININE 0.85 04/06/2017 0225   CALCIUM 8.9 04/06/2017 0225   PROT 6.7 04/05/2017 1615   ALBUMIN 3.8 04/05/2017 1615   AST 21 04/05/2017 1615   ALT 16 (L) 04/05/2017 1615   ALKPHOS 78 04/05/2017 1615   BILITOT 0.7 04/05/2017 1615   GFRNONAA >60 04/06/2017 0225   GFRAA >60 04/06/2017 0225       Component Value Date/Time   WBC 5.8 04/06/2017 0225   RBC 4.19 (L) 04/06/2017 0225   HGB 12.9 (L) 04/06/2017 0225   HCT 40.0 04/06/2017 0225   PLT 216 04/06/2017 0225   MCV 95.5 04/06/2017 0225   MCH 30.8 04/06/2017 0225   MCHC 32.3 04/06/2017 0225   RDW 13.6 04/06/2017 0225   LYMPHSABS 1.1 04/05/2017 1615   MONOABS 0.4 04/05/2017 1615   EOSABS 0.1 04/05/2017 1615   BASOSABS 0.0 04/05/2017 1615    No results found for: POCLITH, LITHIUM   No results found for: PHENYTOIN, PHENOBARB, VALPROATE, CBMZ   .res Assessment: Plan:    Recurrent major depression in full remission ([place]) - Plan: PARoxetine (PAXIL) 20 MG tablet  Generalized anxiety disorder - Plan: PARoxetine (PAXIL) 20 MG tablet  Panic disorder with agoraphobia - Plan: PARoxetine (PAXIL) 20 MG tablet   Continued good response on paroxetine 20 mg a day with resolution of his depression and anxiety disorders.  He is  tolerating the medications well.  No indication for change.  He is tolerating the meds.  No evidence of mania.  He remains sober and committed to [place]  Per his request we will follow-up 1 year.  Lynder Parents MD, DFAPA Please see After Visit Summary for patient specific instructions.  Future Appointments  Date Time Provider [place]  04/21/2019  8:30 AM [facility]  05/22/2019  8:30 AM [facility]  06/23/2019  8:30 AM [facility]  07/24/2019  8:30 AM [facility]  08/25/2019  8:30 AM [facility]  09/25/2019  8:30 AM [facility]  10/27/2019  8:30 AM [facility]  11/27/2019  8:30 AM [facility]  12/29/2019  9:40 AM [facility]    No orders of the defined types were placed in this encounter.     -------------------------------

## 2019-04-21 ENCOUNTER — Ambulatory Visit (INDEPENDENT_AMBULATORY_CARE_PROVIDER_SITE_OTHER): Payer: Medicare Other | Admitting: *Deleted

## 2019-04-21 DIAGNOSIS — G459 Transient cerebral ischemic attack, unspecified: Secondary | ICD-10-CM

## 2019-04-21 LAB — CUP PACEART REMOTE DEVICE CHECK
Date Time Interrogation Session: 20210411033310
Implantable Pulse Generator Implant Date: 20190605

## 2019-04-22 NOTE — Progress Notes (Signed)
ILR Remote 

## 2019-04-23 IMAGING — RF DG BE W/ CM - WO/W KUB
1 series · 14 of 24 positions shown · IV contrast (agent unspecified)
Comparison: CT abdomen pelvis of 02/09/2014

CLINICAL DATA: History of colonic polyps, incomplete colonoscopy,
multiple diverticula by history

EXAM:
BE WITH CONTRAST - WITHOUT AND WITH KUB
CONTRAST:  Full column barium enema
FLUOROSCOPY TIME:  Fluoroscopy Time:  3 minutes 24 seconds
Radiation Exposure Index (if provided by the fluoroscopic device):
504 mGy
Number of Acquired Spot Images: 0

[Series 1: one shot · 0.14mm/px · 14 of 27 slices shown]
[im 1/27]
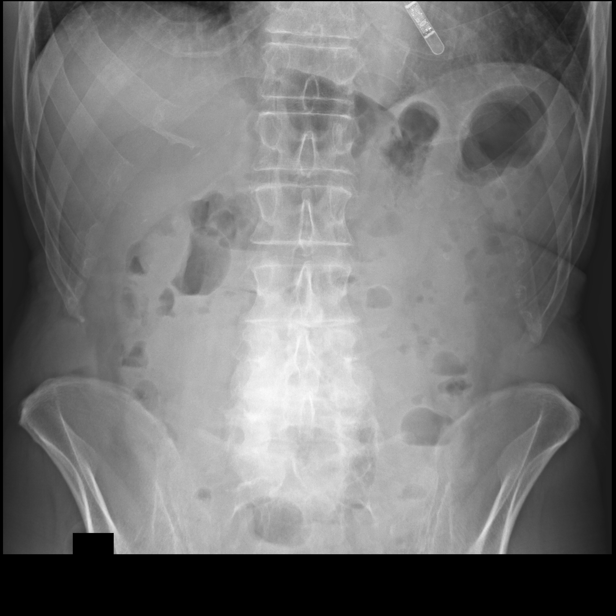
[im 3/27]
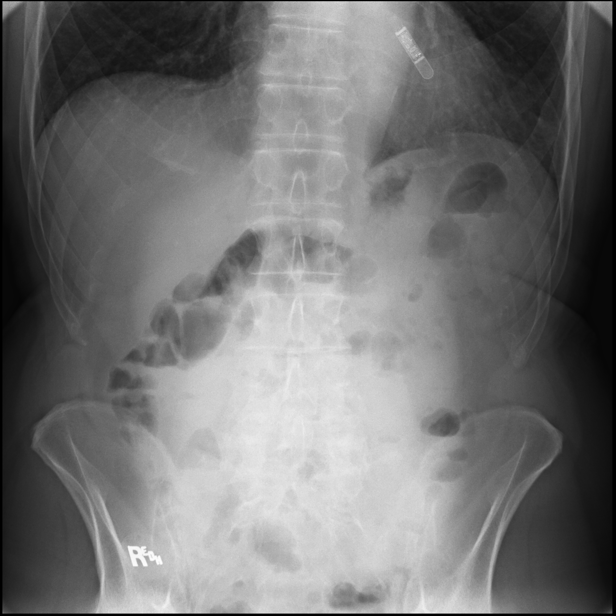
[im 5/27]
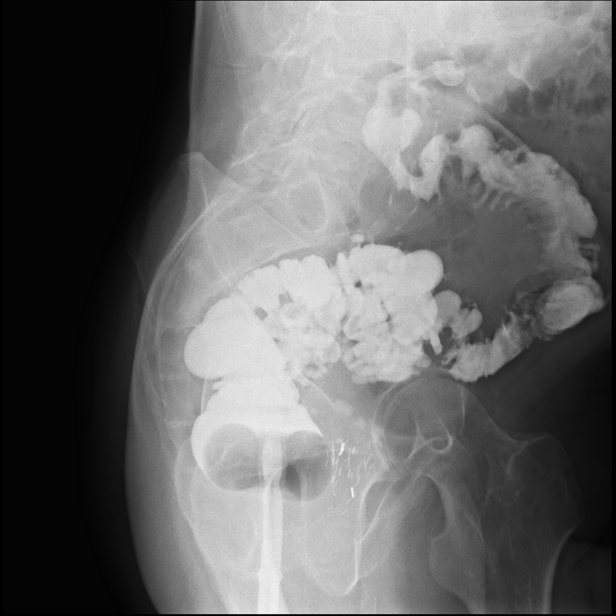
[im 7/27]
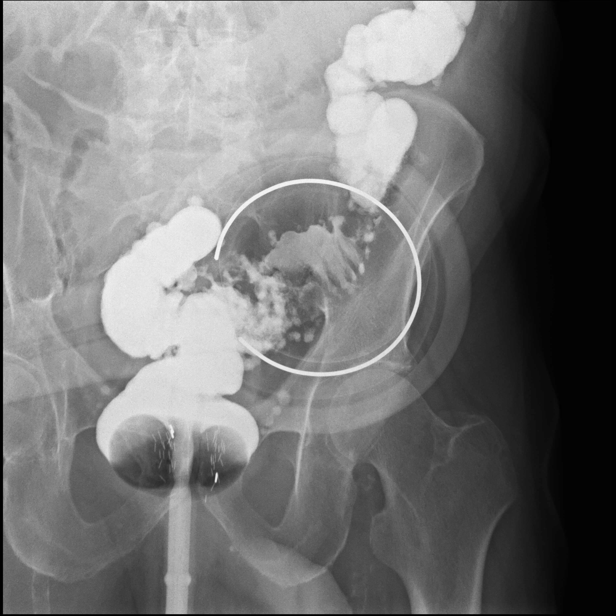
[im 8/27]
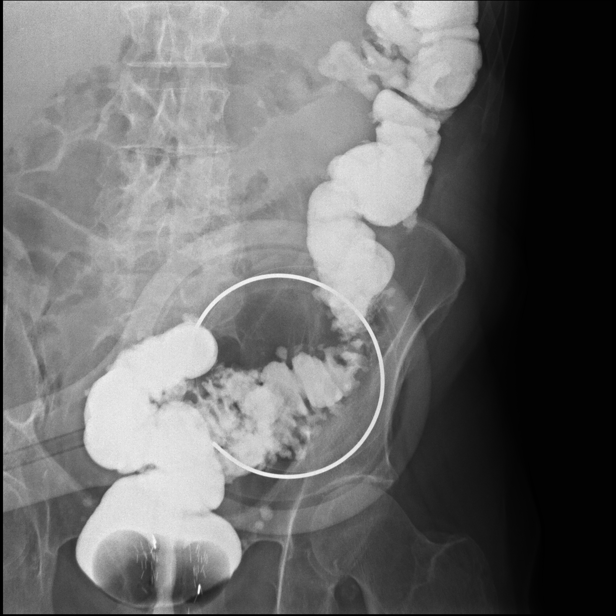
[im 11/27]
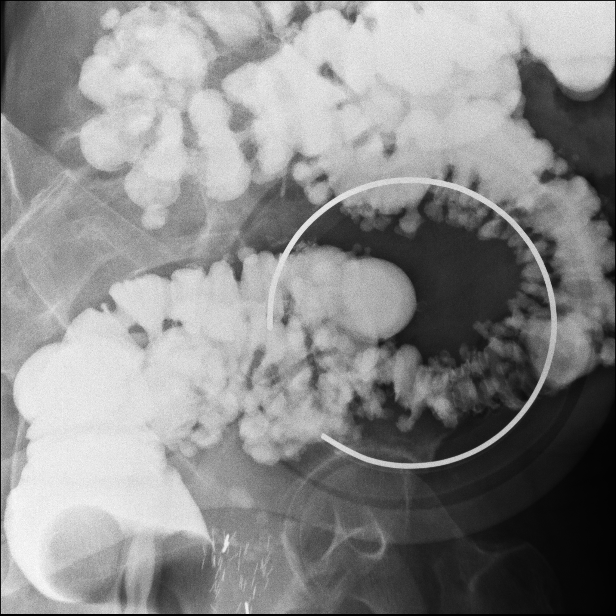
[im 13/27]
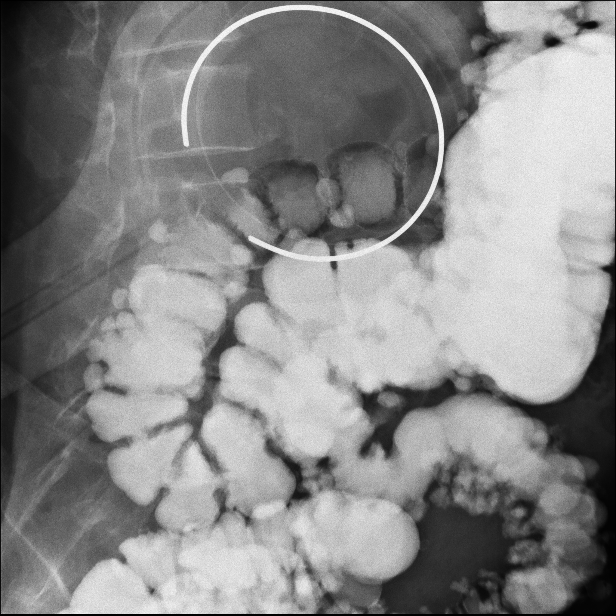
[im 14/27]
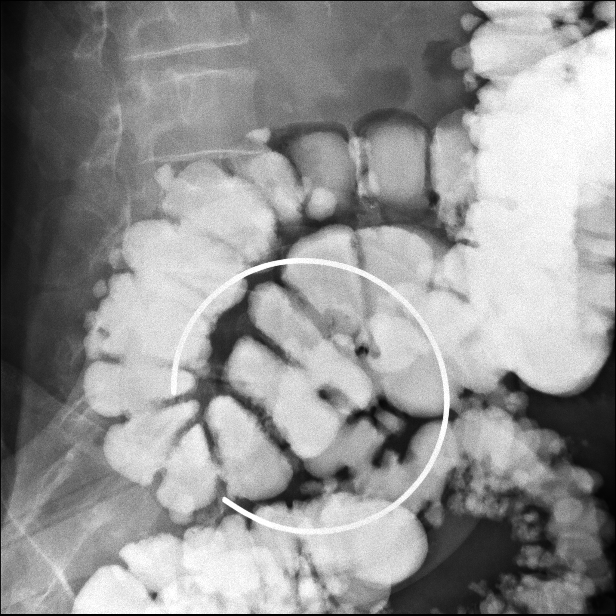
[im 16/27]
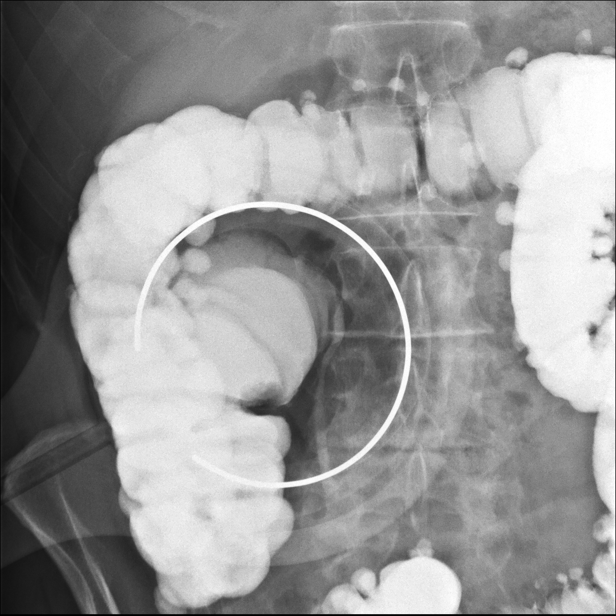
[im 19/27]
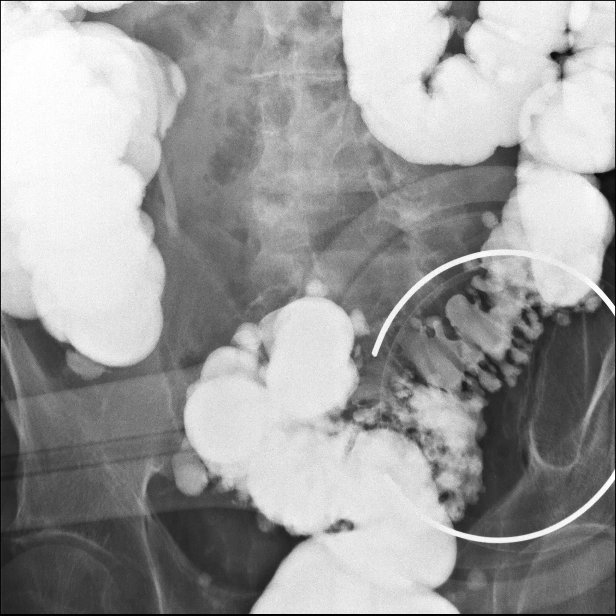
[im 21/27]
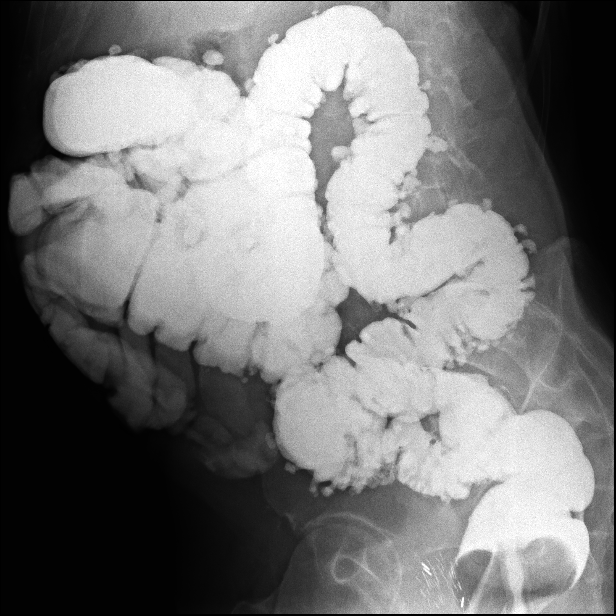
[im 22/27]
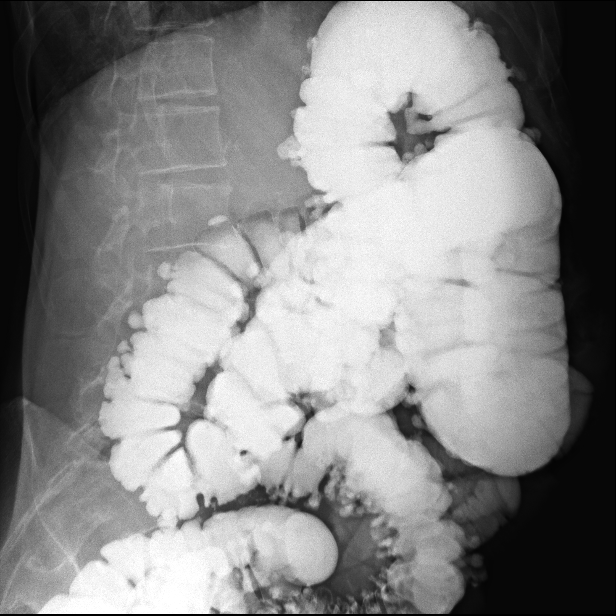
[im 24/27]
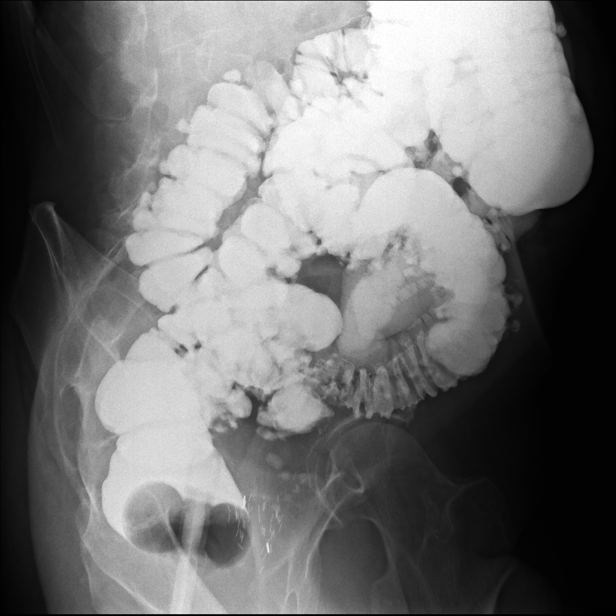
[im 27/27]
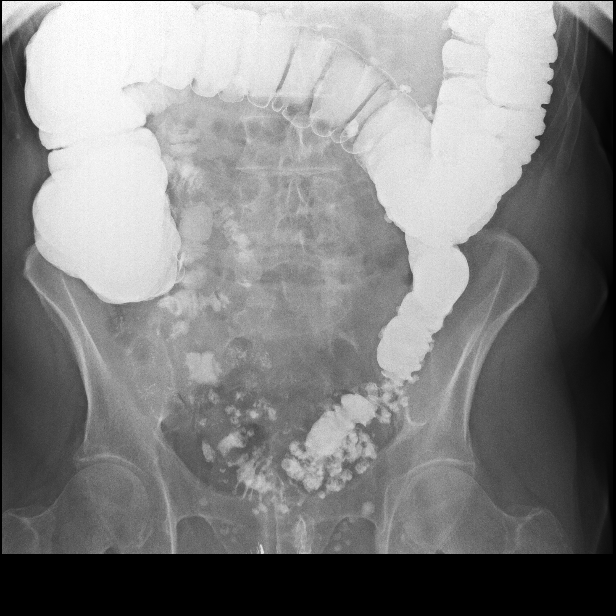

[14 of 24 positions shown; findings below may reference images not displayed]

FINDINGS: On the KUB there is soft tissue haziness tin the mid low pelvis.
This does not appear typical of a distended urinary bladder and CT
of the abdomen pelvis is recommended in view of the patient's
history to exclude a soft tissue mass. Multiple diverticula are
noted with air-filled pockets throughout the colon by KUB. Loop
recorder overlies the left lower chest. No opaque calculi are seen.
An erect view was obtained in view of the recent colonoscopy and no
free air is seen.

Barium entered the colon and there is some delay in passage as
result of diverticular stricture within the rectosigmoid colon.
Multiple diverticula are noted within the rectosigmoid and
descending colon. Diverticula also are scattered throughout the more
proximal colon. No definite mass is seen, but small polyps would be
very difficult to exclude on this study. The cecum curves medially
and no obvious mass is evident. Some contrast does into the small
bowel which is normal on the delayed images.

On post evacuation films multiple diverticula are noted particularly
in the rectosigmoid colon with some spasm and/or stricture present.
IMPRESSION: 1. Multiple diverticula throughout the entire colon with
predominance of diverticula within the rectosigmoid and descending
colon with some spasm and/or stricture of the rectosigmoid colon.
2. No significant abnormality of the right colon is seen which is
barium filled, but small polyps would be difficult to exclude.
3. If further assessment is warranted, either virtual colonoscopy or
barium enema with air would be recommended. VC would be preferred
since the right colon generally does visualize well on that study to
more confidently exclude any right-sided colonic lesion.

## 2019-05-21 LAB — CUP PACEART REMOTE DEVICE CHECK
Date Time Interrogation Session: 20210512032935
Implantable Pulse Generator Implant Date: 20190605

## 2019-05-26 ENCOUNTER — Ambulatory Visit (INDEPENDENT_AMBULATORY_CARE_PROVIDER_SITE_OTHER): Payer: Medicare Other | Admitting: *Deleted

## 2019-05-26 DIAGNOSIS — G459 Transient cerebral ischemic attack, unspecified: Secondary | ICD-10-CM

## 2019-05-26 NOTE — Progress Notes (Signed)
Carelink Summary Report / Loop Recorder 

## 2019-06-30 ENCOUNTER — Ambulatory Visit (INDEPENDENT_AMBULATORY_CARE_PROVIDER_SITE_OTHER): Payer: Medicare Other | Admitting: *Deleted

## 2019-06-30 DIAGNOSIS — G459 Transient cerebral ischemic attack, unspecified: Secondary | ICD-10-CM

## 2019-06-30 LAB — CUP PACEART REMOTE DEVICE CHECK
Date Time Interrogation Session: 20210620235857
Implantable Pulse Generator Implant Date: 20190605

## 2019-06-30 NOTE — Progress Notes (Signed)
Carelink Summary Report / Loop Recorder 

## 2019-08-04 ENCOUNTER — Ambulatory Visit (INDEPENDENT_AMBULATORY_CARE_PROVIDER_SITE_OTHER): Payer: Medicare Other | Admitting: *Deleted

## 2019-08-04 DIAGNOSIS — G459 Transient cerebral ischemic attack, unspecified: Secondary | ICD-10-CM | POA: Diagnosis not present

## 2019-08-04 LAB — CUP PACEART REMOTE DEVICE CHECK
Date Time Interrogation Session: 20210725232930
Implantable Pulse Generator Implant Date: 20190605

## 2019-08-05 NOTE — Progress Notes (Signed)
Carelink Summary Report / Loop Recorder 

## 2019-09-07 LAB — CUP PACEART REMOTE DEVICE CHECK
Date Time Interrogation Session: 20210827233841
Implantable Pulse Generator Implant Date: 20190605

## 2019-09-08 ENCOUNTER — Ambulatory Visit (INDEPENDENT_AMBULATORY_CARE_PROVIDER_SITE_OTHER): Payer: Medicare Other | Admitting: *Deleted

## 2019-09-08 DIAGNOSIS — G459 Transient cerebral ischemic attack, unspecified: Secondary | ICD-10-CM | POA: Diagnosis not present

## 2019-09-09 NOTE — Progress Notes (Signed)
Carelink Summary Report / Loop Recorder 

## 2019-10-09 LAB — CUP PACEART REMOTE DEVICE CHECK
Date Time Interrogation Session: 20210929233644
Implantable Pulse Generator Implant Date: 20190605

## 2019-10-13 ENCOUNTER — Ambulatory Visit (INDEPENDENT_AMBULATORY_CARE_PROVIDER_SITE_OTHER): Payer: Medicare Other

## 2019-10-13 DIAGNOSIS — I639 Cerebral infarction, unspecified: Secondary | ICD-10-CM | POA: Diagnosis not present

## 2019-10-14 NOTE — Progress Notes (Signed)
Carelink Summary Report / Loop Recorder 

## 2019-11-12 LAB — CUP PACEART REMOTE DEVICE CHECK
Date Time Interrogation Session: 20211101233721
Implantable Pulse Generator Implant Date: 20190605

## 2019-11-17 ENCOUNTER — Ambulatory Visit (INDEPENDENT_AMBULATORY_CARE_PROVIDER_SITE_OTHER): Payer: Medicare Other

## 2019-11-17 DIAGNOSIS — I639 Cerebral infarction, unspecified: Secondary | ICD-10-CM | POA: Diagnosis not present

## 2019-11-17 NOTE — Progress Notes (Signed)
Carelink Summary Report / Loop Recorder 

## 2019-12-20 LAB — CUP PACEART REMOTE DEVICE CHECK
Date Time Interrogation Session: 20211204224223
Implantable Pulse Generator Implant Date: 20190605

## 2019-12-22 ENCOUNTER — Telehealth: Payer: Self-pay

## 2019-12-22 ENCOUNTER — Ambulatory Visit (INDEPENDENT_AMBULATORY_CARE_PROVIDER_SITE_OTHER): Payer: Medicare Other

## 2019-12-22 DIAGNOSIS — I639 Cerebral infarction, unspecified: Secondary | ICD-10-CM

## 2019-12-22 NOTE — Telephone Encounter (Signed)
Patient reports his wife has recently passed and he has been under a great deal of stress. ILR remote reviewed, no true AF noted or other events. Patient states he has already spoken to PCP and stated he has an ECHO to be completed soon. Advised patient to continue that follow up and call with any further questions or concerns. Patient verbalized understanding.

## 2019-12-22 NOTE — Telephone Encounter (Signed)
The pt states he been having some palpitations. I let him speak with device nurse Leigh, rn.

## 2020-01-06 NOTE — Progress Notes (Signed)
Carelink Summary Report / Loop Recorder 

## 2020-01-16 ENCOUNTER — Telehealth: Payer: Self-pay | Admitting: Psychiatry

## 2020-01-16 NOTE — Telephone Encounter (Signed)
Pt called and said that his wife died of kidney cancer after 23 days in hospital fighting and she died on Dec 23, 2022. Jon Hughes didn't take his 20 mg paxil from dec 25th to dec 28th because he was with his son. He started back on 20 mg on dec 29th. However, yesterday he crashed and could not really function. Today he took 30 mg of paxil and he wanted you to know that and see if you want to see him before march. He also wants to know if you are ok with the increase of the medicine or do you want him to add anything. Please give him a call at [phone]

## 2020-01-26 ENCOUNTER — Ambulatory Visit (INDEPENDENT_AMBULATORY_CARE_PROVIDER_SITE_OTHER): Payer: Medicare HMO

## 2020-01-26 DIAGNOSIS — G459 Transient cerebral ischemic attack, unspecified: Secondary | ICD-10-CM

## 2020-01-27 LAB — CUP PACEART REMOTE DEVICE CHECK
Date Time Interrogation Session: 20220115235503
Implantable Pulse Generator Implant Date: 20190605

## 2020-01-30 DIAGNOSIS — M7981 Nontraumatic hematoma of soft tissue: Secondary | ICD-10-CM | POA: Diagnosis not present

## 2020-01-30 DIAGNOSIS — D0461 Carcinoma in situ of skin of right upper limb, including shoulder: Secondary | ICD-10-CM | POA: Diagnosis not present

## 2020-02-09 DIAGNOSIS — G459 Transient cerebral ischemic attack, unspecified: Secondary | ICD-10-CM | POA: Diagnosis not present

## 2020-02-09 DIAGNOSIS — C61 Malignant neoplasm of prostate: Secondary | ICD-10-CM | POA: Diagnosis not present

## 2020-02-09 DIAGNOSIS — E785 Hyperlipidemia, unspecified: Secondary | ICD-10-CM | POA: Diagnosis not present

## 2020-02-09 NOTE — Progress Notes (Signed)
Carelink Summary Report / Loop Recorder 

## 2020-02-16 DIAGNOSIS — Z7982 Long term (current) use of aspirin: Secondary | ICD-10-CM | POA: Diagnosis not present

## 2020-02-16 DIAGNOSIS — Z8546 Personal history of malignant neoplasm of prostate: Secondary | ICD-10-CM | POA: Diagnosis not present

## 2020-02-16 DIAGNOSIS — Z85828 Personal history of other malignant neoplasm of skin: Secondary | ICD-10-CM | POA: Diagnosis not present

## 2020-02-16 DIAGNOSIS — R69 Illness, unspecified: Secondary | ICD-10-CM | POA: Diagnosis not present

## 2020-02-16 DIAGNOSIS — E785 Hyperlipidemia, unspecified: Secondary | ICD-10-CM | POA: Diagnosis not present

## 2020-02-16 DIAGNOSIS — Z604 Social exclusion and rejection: Secondary | ICD-10-CM | POA: Diagnosis not present

## 2020-02-16 DIAGNOSIS — Z809 Family history of malignant neoplasm, unspecified: Secondary | ICD-10-CM | POA: Diagnosis not present

## 2020-02-16 DIAGNOSIS — N529 Male erectile dysfunction, unspecified: Secondary | ICD-10-CM | POA: Diagnosis not present

## 2020-02-16 DIAGNOSIS — N400 Enlarged prostate without lower urinary tract symptoms: Secondary | ICD-10-CM | POA: Diagnosis not present

## 2020-02-16 DIAGNOSIS — Z8673 Personal history of transient ischemic attack (TIA), and cerebral infarction without residual deficits: Secondary | ICD-10-CM | POA: Diagnosis not present

## 2020-02-17 DIAGNOSIS — Z8546 Personal history of malignant neoplasm of prostate: Secondary | ICD-10-CM | POA: Diagnosis not present

## 2020-02-24 DIAGNOSIS — N5201 Erectile dysfunction due to arterial insufficiency: Secondary | ICD-10-CM | POA: Diagnosis not present

## 2020-02-24 DIAGNOSIS — Z8546 Personal history of malignant neoplasm of prostate: Secondary | ICD-10-CM | POA: Diagnosis not present

## 2020-02-24 DIAGNOSIS — R3915 Urgency of urination: Secondary | ICD-10-CM | POA: Diagnosis not present

## 2020-03-01 ENCOUNTER — Ambulatory Visit (INDEPENDENT_AMBULATORY_CARE_PROVIDER_SITE_OTHER): Payer: Medicare HMO

## 2020-03-01 DIAGNOSIS — I639 Cerebral infarction, unspecified: Secondary | ICD-10-CM

## 2020-03-01 LAB — CUP PACEART REMOTE DEVICE CHECK
Date Time Interrogation Session: 20220217235829
Implantable Pulse Generator Implant Date: 20190605

## 2020-03-03 DIAGNOSIS — G459 Transient cerebral ischemic attack, unspecified: Secondary | ICD-10-CM | POA: Diagnosis not present

## 2020-03-03 DIAGNOSIS — C61 Malignant neoplasm of prostate: Secondary | ICD-10-CM | POA: Diagnosis not present

## 2020-03-03 DIAGNOSIS — E785 Hyperlipidemia, unspecified: Secondary | ICD-10-CM | POA: Diagnosis not present

## 2020-03-05 NOTE — Progress Notes (Signed)
Carelink Summary Report / Loop Recorder 

## 2020-03-11 ENCOUNTER — Ambulatory Visit (INDEPENDENT_AMBULATORY_CARE_PROVIDER_SITE_OTHER): Payer: Medicare HMO | Admitting: Psychiatry

## 2020-03-11 ENCOUNTER — Encounter: Payer: Self-pay | Admitting: Psychiatry

## 2020-03-11 ENCOUNTER — Other Ambulatory Visit: Payer: Self-pay

## 2020-03-11 DIAGNOSIS — R69 Illness, unspecified: Secondary | ICD-10-CM | POA: Diagnosis not present

## 2020-03-11 DIAGNOSIS — F3342 Major depressive disorder, recurrent, in full remission: Secondary | ICD-10-CM

## 2020-03-11 DIAGNOSIS — F4001 Agoraphobia with panic disorder: Secondary | ICD-10-CM

## 2020-03-11 DIAGNOSIS — F411 Generalized anxiety disorder: Secondary | ICD-10-CM

## 2020-03-11 MED ORDER — PAROXETINE HCL 20 MG PO TABS: 30.0000 mg | ORAL_TABLET | Freq: Every day | ORAL | 3 refills | Status: DC

## 2020-03-11 NOTE — Progress Notes (Signed)
Jon Hughes [id number] Apr 27, 1935 85 y.o.  Subjective:   Patient ID:  Jon Hughes is a 85 y.o. (DOB 1935/06/04) male.  Chief Complaint:  Chief Complaint  Patient presents with  . Recurrent major depression in full remission (Jon Hughes)  . Follow-up    grief    Depression         Jon Hughes presents to the office today for follow-up of depression and anxiety.  He did have a TIA on April 05, 2017 with extensive neurologic and cardiac work-up which did not reveal the precise cause.  There was no residual damage.  He has been given a heart monitor since that time and there have been no unusual occurrences.  He was doing well March 2020 at his last visit.  No meds were changed.  Still goo mood.  Covid has been good to him. For awhile he was listless and couldn't do [employer].   But lately taken more active stance. Has developed, created [employer].   Patient reports stable mood and denies depressed or irritable moods. Typically enthusiastic. Not aware of manias.   Patient denies any recent difficulty with anxiety.  Patient denies difficulty with sleep initiation or maintenance. Denies appetite disturbance.  Patient reports that energy and motivation have been good.  Patient denies any difficulty with concentration.  Patient denies any suicidal ideation. He and his wife have less struggle financially due to [employer] and  are otherwise doing okay. Sons and family are doing well.     2020-03-12 appt noted: Wife died of cancer.  Still involved in AA.  Has some supportive people.   Dealing with missing wife, Jon Hughes.  Sister Jon Hughes died.  Struggling with grief.  Will start grief group. Found out he was adopted when he was a teenager. AA helped him work through anger at his parents.  Bio father he never met due to his death.   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  [employer].   Sober for years.  Passionate and engaged in faith.  Better getting  OOB after increase paroxetine to 30 mg daily.  Past psychiatric meds.  He has been under my psychiatric care since 1997 for major depression and generalized anxiety disorder with panic.  Most of that time is been on paroxetine with good response.  His last significant relapse was in 2014.  He failed to respond to citalopram at that time.  Did respond to brief potentiation with Abilify on Paxil 30 mg.  Since then he has been maintained on Paxil 20 mg effectively.  He has remote history of alcohol dependence but has been sober and active in [place] for 29 years  Review of Systems:  Review of Systems  Cardiovascular: Negative for palpitations.  Musculoskeletal: Positive for arthralgias.  Neurological: Negative for dizziness, tremors and weakness.  Psychiatric/Behavioral: Positive for depression.    Medications: I have reviewed the patient's current medications.  Current Outpatient Medications  Medication Sig Dispense Refill  . aspirin EC 81 MG tablet Take 81 mg by mouth daily.    Marland Kitchen atorvastatin (LIPITOR) 80 MG tablet Take 1 tablet (80 mg total) by mouth daily at 6 PM. (Patient taking differently: Take 80 mg by mouth daily at 6 PM. 1/2 tablet in the morning.) 90 tablet 3  . b complex vitamins capsule Take 1 capsule by mouth daily.    Marland Kitchen OVER THE COUNTER MEDICATION Take 1 Dose by mouth daily. OPC-3 anti oxidant    .  Psyllium (METAMUCIL PO) Take 1 Dose by mouth daily.    . tamsulosin (FLOMAX) 0.4 MG CAPS capsule Take 0.4 mg by mouth daily.     Marland Kitchen VITAMIN D PO Take by mouth.    Marland Kitchen ibuprofen (ADVIL,MOTRIN) 200 MG tablet Take 200-400 mg by mouth daily as needed for headache or moderate pain. (Patient not taking: Reported on 03/11/2020)    . PARoxetine (PAXIL) 20 MG tablet Take 1.5 tablets (30 mg total) by mouth daily. 135 tablet 3   No current facility-administered medications for this visit.    Medication Side Effects: None  Allergies:  Allergies  Allergen Reactions  . Codeine Nausea Only  . Sulfa  Antibiotics Rash    while in the Military    Past Medical History:  Diagnosis Date  . Cardiac murmur   . History of tachycardia   . Hx of anxiety disorder   . Hx of major depression   . Hyperlipidemia   . Malignant neoplasm of prostate ([place]) 03/12/2014  . Prostate cancer ([place])   . Stroke ([place])   . TIA (transient ischemic attack) 04/05/2017    Family History  Problem Relation Age of Onset  . Liver cancer Mother   . Breast cancer Sister   . Multiple myeloma Sister   . Throat cancer Brother     Social History   Socioeconomic History  . Marital status: Divorced    Spouse name: Not on file  . Number of children: 2  . Years of education: Not on file  . Highest education level: Not on file  Occupational History  . Occupation: Pharmacologist  Tobacco Use  . Smoking status: Former Smoker    Packs/day: 1.00    Years: 8.00    Pack years: 8.00    Types: Cigarettes  . Smokeless tobacco: Never Used  Substance and Sexual Activity  . Alcohol use: No    Alcohol/week: 0.0 standard drinks  . Drug use: No  . Sexual activity: Not on file  Other Topics Concern  . Not on file  Social History Narrative  . Not on file   Social Determinants of Health   Financial Resource Strain: Not on file  Food Insecurity: Not on file  Transportation Needs: Not on file  Physical Activity: Not on file  Stress: Not on file  Social Connections: Not on file  Intimate Partner Violence: Not on file    Past Medical History, Surgical history, Social history, and Family history were reviewed and updated as appropriate.   Please see review of systems for further details on the patient's review from today.   Objective:   Physical Exam:  There were no vitals taken for this visit.  Physical Exam Constitutional:      General: He is not in acute distress.    Appearance: He is well-developed.  Musculoskeletal:        General: No deformity.  Neurological:     Mental Status: He is alert and oriented to  person, place, and time.     Coordination: Coordination normal.  Psychiatric:        Attention and Perception: Attention normal. He is attentive.        Mood and Affect: Mood normal. Mood is not anxious or depressed. Affect is not labile, blunt, angry, tearful or inappropriate.        Speech: Speech normal.        Behavior: Behavior normal.        Thought Content: Thought content normal. Thought content does  not include homicidal or suicidal ideation. Thought content does not include homicidal or suicidal plan.        Cognition and Memory: Cognition normal.        Judgment: Judgment normal.     Comments: Insight is good. Talkative and pleasant.     Lab Review:     Component Value Date/Time   NA 138 04/06/2017 0225   K 3.6 04/06/2017 0225   CL 104 04/06/2017 0225   CO2 27 04/06/2017 0225   GLUCOSE 98 04/06/2017 0225   BUN 9 04/06/2017 0225   CREATININE 0.85 04/06/2017 0225   CALCIUM 8.9 04/06/2017 0225   PROT 6.7 04/05/2017 1615   ALBUMIN 3.8 04/05/2017 1615   AST 21 04/05/2017 1615   ALT 16 (L) 04/05/2017 1615   ALKPHOS 78 04/05/2017 1615   BILITOT 0.7 04/05/2017 1615   GFRNONAA >60 04/06/2017 0225   GFRAA >60 04/06/2017 0225       Component Value Date/Time   WBC 5.8 04/06/2017 0225   RBC 4.19 (L) 04/06/2017 0225   HGB 12.9 (L) 04/06/2017 0225   HCT 40.0 04/06/2017 0225   PLT 216 04/06/2017 0225   MCV 95.5 04/06/2017 0225   MCH 30.8 04/06/2017 0225   MCHC 32.3 04/06/2017 0225   RDW 13.6 04/06/2017 0225   LYMPHSABS 1.1 04/05/2017 1615   MONOABS 0.4 04/05/2017 1615   EOSABS 0.1 04/05/2017 1615   BASOSABS 0.0 04/05/2017 1615    No results found for: POCLITH, LITHIUM   No results found for: PHENYTOIN, PHENOBARB, VALPROATE, CBMZ   .res Assessment: Plan:    Recurrent major depression in full remission ([place]) - Plan: PARoxetine (PAXIL) 20 MG tablet  Generalized anxiety disorder - Plan: PARoxetine (PAXIL) 20 MG tablet  Panic disorder with agoraphobia - Plan:  PARoxetine (PAXIL) 20 MG tablet Grief   Continued good response on paroxetine and better on 30 mg a day with resolution of his depression and anxiety disorders.  He is still dealing with grief. He is tolerating the medications well.  No indication for change.  He is tolerating the meds.  No evidence of mania.  He remains sober and committed to [place]  Per his request we will follow-up 1 year.  Lynder Parents MD, DFAPA Please see After Visit Summary for patient specific instructions.  Future Appointments  Date Time Provider [place]  04/05/2020  9:50 AM [facility] DEVICE REMOTES [facility]  05/10/2020  9:50 AM [facility] DEVICE REMOTES [facility]  06/14/2020  9:50 AM [facility] DEVICE REMOTES [facility]  07/19/2020  9:50 AM [facility] DEVICE REMOTES [facility]    No orders of the defined types were placed in this encounter.     -------------------------------

## 2020-03-18 ENCOUNTER — Ambulatory Visit: Payer: Medicare Other | Admitting: Psychiatry

## 2020-03-24 DIAGNOSIS — C44629 Squamous cell carcinoma of skin of left upper limb, including shoulder: Secondary | ICD-10-CM | POA: Diagnosis not present

## 2020-03-24 DIAGNOSIS — D1801 Hemangioma of skin and subcutaneous tissue: Secondary | ICD-10-CM | POA: Diagnosis not present

## 2020-03-24 DIAGNOSIS — C44729 Squamous cell carcinoma of skin of left lower limb, including hip: Secondary | ICD-10-CM | POA: Diagnosis not present

## 2020-03-24 DIAGNOSIS — D225 Melanocytic nevi of trunk: Secondary | ICD-10-CM | POA: Diagnosis not present

## 2020-03-24 DIAGNOSIS — L570 Actinic keratosis: Secondary | ICD-10-CM | POA: Diagnosis not present

## 2020-03-24 DIAGNOSIS — Z85828 Personal history of other malignant neoplasm of skin: Secondary | ICD-10-CM | POA: Diagnosis not present

## 2020-03-24 DIAGNOSIS — L821 Other seborrheic keratosis: Secondary | ICD-10-CM | POA: Diagnosis not present

## 2020-03-24 DIAGNOSIS — L814 Other melanin hyperpigmentation: Secondary | ICD-10-CM | POA: Diagnosis not present

## 2020-03-29 DIAGNOSIS — M6289 Other specified disorders of muscle: Secondary | ICD-10-CM | POA: Diagnosis not present

## 2020-03-29 DIAGNOSIS — M6281 Muscle weakness (generalized): Secondary | ICD-10-CM | POA: Diagnosis not present

## 2020-03-29 DIAGNOSIS — R150 Incomplete defecation: Secondary | ICD-10-CM | POA: Diagnosis not present

## 2020-03-29 DIAGNOSIS — R151 Fecal smearing: Secondary | ICD-10-CM | POA: Diagnosis not present

## 2020-04-03 LAB — CUP PACEART REMOTE DEVICE CHECK
Date Time Interrogation Session: 20220323010034
Implantable Pulse Generator Implant Date: 20190605

## 2020-04-05 ENCOUNTER — Ambulatory Visit (INDEPENDENT_AMBULATORY_CARE_PROVIDER_SITE_OTHER): Payer: Medicare HMO

## 2020-04-05 DIAGNOSIS — I639 Cerebral infarction, unspecified: Secondary | ICD-10-CM

## 2020-04-12 DIAGNOSIS — F419 Anxiety disorder, unspecified: Secondary | ICD-10-CM | POA: Diagnosis not present

## 2020-04-12 DIAGNOSIS — R159 Full incontinence of feces: Secondary | ICD-10-CM | POA: Diagnosis not present

## 2020-04-12 DIAGNOSIS — I471 Supraventricular tachycardia: Secondary | ICD-10-CM | POA: Diagnosis not present

## 2020-04-12 DIAGNOSIS — G479 Sleep disorder, unspecified: Secondary | ICD-10-CM | POA: Diagnosis not present

## 2020-04-12 DIAGNOSIS — R32 Unspecified urinary incontinence: Secondary | ICD-10-CM | POA: Diagnosis not present

## 2020-04-12 DIAGNOSIS — E538 Deficiency of other specified B group vitamins: Secondary | ICD-10-CM | POA: Diagnosis not present

## 2020-04-12 DIAGNOSIS — E559 Vitamin D deficiency, unspecified: Secondary | ICD-10-CM | POA: Diagnosis not present

## 2020-04-12 DIAGNOSIS — Z0001 Encounter for general adult medical examination with abnormal findings: Secondary | ICD-10-CM | POA: Diagnosis not present

## 2020-04-12 DIAGNOSIS — N401 Enlarged prostate with lower urinary tract symptoms: Secondary | ICD-10-CM | POA: Diagnosis not present

## 2020-04-12 DIAGNOSIS — I700 Atherosclerosis of aorta: Secondary | ICD-10-CM | POA: Diagnosis not present

## 2020-04-12 DIAGNOSIS — R69 Illness, unspecified: Secondary | ICD-10-CM | POA: Diagnosis not present

## 2020-04-12 DIAGNOSIS — E785 Hyperlipidemia, unspecified: Secondary | ICD-10-CM | POA: Diagnosis not present

## 2020-04-12 DIAGNOSIS — G459 Transient cerebral ischemic attack, unspecified: Secondary | ICD-10-CM | POA: Diagnosis not present

## 2020-04-15 NOTE — Progress Notes (Signed)
Carelink Summary Report / Loop Recorder 

## 2020-05-03 ENCOUNTER — Ambulatory Visit (INDEPENDENT_AMBULATORY_CARE_PROVIDER_SITE_OTHER): Payer: Medicare HMO

## 2020-05-03 DIAGNOSIS — G459 Transient cerebral ischemic attack, unspecified: Secondary | ICD-10-CM | POA: Diagnosis not present

## 2020-05-04 LAB — CUP PACEART REMOTE DEVICE CHECK
Date Time Interrogation Session: 20220425010108
Implantable Pulse Generator Implant Date: 20190605

## 2020-05-20 NOTE — Progress Notes (Signed)
Carelink Summary Report / Loop Recorder 

## 2020-06-02 ENCOUNTER — Telehealth: Payer: Self-pay

## 2020-06-02 NOTE — Telephone Encounter (Signed)
The patient states his handheld is being replace and he shall receive it in 7-10 business days.

## 2020-06-07 LAB — CUP PACEART REMOTE DEVICE CHECK
Date Time Interrogation Session: 20220528011045
Implantable Pulse Generator Implant Date: 20190605

## 2020-06-08 ENCOUNTER — Ambulatory Visit (INDEPENDENT_AMBULATORY_CARE_PROVIDER_SITE_OTHER): Payer: Medicare HMO

## 2020-06-08 DIAGNOSIS — G459 Transient cerebral ischemic attack, unspecified: Secondary | ICD-10-CM | POA: Diagnosis not present

## 2020-06-30 NOTE — Progress Notes (Signed)
Carelink Summary Report / Loop Recorder 

## 2020-07-09 ENCOUNTER — Ambulatory Visit (INDEPENDENT_AMBULATORY_CARE_PROVIDER_SITE_OTHER): Payer: Medicare HMO

## 2020-07-09 DIAGNOSIS — G459 Transient cerebral ischemic attack, unspecified: Secondary | ICD-10-CM

## 2020-07-12 LAB — CUP PACEART REMOTE DEVICE CHECK
Date Time Interrogation Session: 20220630011516
Implantable Pulse Generator Implant Date: 20190605

## 2020-07-28 NOTE — Progress Notes (Signed)
Carelink Summary Report / Loop Recorder 

## 2020-08-10 LAB — CUP PACEART REMOTE DEVICE CHECK
Date Time Interrogation Session: 20220802011955
Implantable Pulse Generator Implant Date: 20190605

## 2020-08-11 ENCOUNTER — Ambulatory Visit (INDEPENDENT_AMBULATORY_CARE_PROVIDER_SITE_OTHER): Payer: Medicare HMO

## 2020-08-11 DIAGNOSIS — I639 Cerebral infarction, unspecified: Secondary | ICD-10-CM

## 2020-08-11 DIAGNOSIS — R69 Illness, unspecified: Secondary | ICD-10-CM | POA: Diagnosis not present

## 2020-08-11 DIAGNOSIS — E785 Hyperlipidemia, unspecified: Secondary | ICD-10-CM | POA: Diagnosis not present

## 2020-08-11 DIAGNOSIS — R059 Cough, unspecified: Secondary | ICD-10-CM | POA: Diagnosis not present

## 2020-08-11 DIAGNOSIS — I10 Essential (primary) hypertension: Secondary | ICD-10-CM | POA: Diagnosis not present

## 2020-08-23 DIAGNOSIS — Z8546 Personal history of malignant neoplasm of prostate: Secondary | ICD-10-CM | POA: Diagnosis not present

## 2020-08-30 DIAGNOSIS — Z8546 Personal history of malignant neoplasm of prostate: Secondary | ICD-10-CM | POA: Diagnosis not present

## 2020-08-30 DIAGNOSIS — R3915 Urgency of urination: Secondary | ICD-10-CM | POA: Diagnosis not present

## 2020-09-07 NOTE — Progress Notes (Signed)
Carelink Summary Report / Loop Recorder 

## 2020-09-10 DIAGNOSIS — I10 Essential (primary) hypertension: Secondary | ICD-10-CM | POA: Diagnosis not present

## 2020-09-10 DIAGNOSIS — N401 Enlarged prostate with lower urinary tract symptoms: Secondary | ICD-10-CM | POA: Diagnosis not present

## 2020-09-10 DIAGNOSIS — E785 Hyperlipidemia, unspecified: Secondary | ICD-10-CM | POA: Diagnosis not present

## 2020-09-10 DIAGNOSIS — C61 Malignant neoplasm of prostate: Secondary | ICD-10-CM | POA: Diagnosis not present

## 2020-09-10 DIAGNOSIS — G459 Transient cerebral ischemic attack, unspecified: Secondary | ICD-10-CM | POA: Diagnosis not present

## 2020-09-14 ENCOUNTER — Ambulatory Visit (INDEPENDENT_AMBULATORY_CARE_PROVIDER_SITE_OTHER): Payer: Medicare HMO

## 2020-09-14 DIAGNOSIS — I639 Cerebral infarction, unspecified: Secondary | ICD-10-CM

## 2020-09-14 DIAGNOSIS — N200 Calculus of kidney: Secondary | ICD-10-CM | POA: Diagnosis not present

## 2020-09-14 DIAGNOSIS — K5730 Diverticulosis of large intestine without perforation or abscess without bleeding: Secondary | ICD-10-CM | POA: Diagnosis not present

## 2020-09-14 DIAGNOSIS — N3289 Other specified disorders of bladder: Secondary | ICD-10-CM | POA: Diagnosis not present

## 2020-09-14 DIAGNOSIS — N400 Enlarged prostate without lower urinary tract symptoms: Secondary | ICD-10-CM | POA: Diagnosis not present

## 2020-09-14 DIAGNOSIS — R1084 Generalized abdominal pain: Secondary | ICD-10-CM | POA: Diagnosis not present

## 2020-09-14 LAB — CUP PACEART REMOTE DEVICE CHECK
Date Time Interrogation Session: 20220904012134
Implantable Pulse Generator Implant Date: 20190605

## 2020-09-22 NOTE — Progress Notes (Signed)
Carelink Summary Report / Loop Recorder 

## 2020-09-30 DIAGNOSIS — L814 Other melanin hyperpigmentation: Secondary | ICD-10-CM | POA: Diagnosis not present

## 2020-09-30 DIAGNOSIS — H35372 Puckering of macula, left eye: Secondary | ICD-10-CM | POA: Diagnosis not present

## 2020-09-30 DIAGNOSIS — L570 Actinic keratosis: Secondary | ICD-10-CM | POA: Diagnosis not present

## 2020-09-30 DIAGNOSIS — Z961 Presence of intraocular lens: Secondary | ICD-10-CM | POA: Diagnosis not present

## 2020-09-30 DIAGNOSIS — L821 Other seborrheic keratosis: Secondary | ICD-10-CM | POA: Diagnosis not present

## 2020-09-30 DIAGNOSIS — H524 Presbyopia: Secondary | ICD-10-CM | POA: Diagnosis not present

## 2020-09-30 DIAGNOSIS — Z85828 Personal history of other malignant neoplasm of skin: Secondary | ICD-10-CM | POA: Diagnosis not present

## 2020-09-30 DIAGNOSIS — S70361A Insect bite (nonvenomous), right thigh, initial encounter: Secondary | ICD-10-CM | POA: Diagnosis not present

## 2020-09-30 DIAGNOSIS — H52203 Unspecified astigmatism, bilateral: Secondary | ICD-10-CM | POA: Diagnosis not present

## 2020-09-30 DIAGNOSIS — D1801 Hemangioma of skin and subcutaneous tissue: Secondary | ICD-10-CM | POA: Diagnosis not present

## 2020-09-30 DIAGNOSIS — D045 Carcinoma in situ of skin of trunk: Secondary | ICD-10-CM | POA: Diagnosis not present

## 2020-10-18 ENCOUNTER — Ambulatory Visit (INDEPENDENT_AMBULATORY_CARE_PROVIDER_SITE_OTHER): Payer: Medicare HMO

## 2020-10-18 DIAGNOSIS — I639 Cerebral infarction, unspecified: Secondary | ICD-10-CM

## 2020-10-20 LAB — CUP PACEART REMOTE DEVICE CHECK
Date Time Interrogation Session: 20221007022231
Implantable Pulse Generator Implant Date: 20190605

## 2020-10-27 NOTE — Progress Notes (Signed)
Carelink Summary Report / Loop Recorder 

## 2020-11-18 LAB — CUP PACEART REMOTE DEVICE CHECK
Date Time Interrogation Session: 20221109012151
Implantable Pulse Generator Implant Date: 20190605

## 2020-11-22 ENCOUNTER — Ambulatory Visit (INDEPENDENT_AMBULATORY_CARE_PROVIDER_SITE_OTHER): Payer: Medicare HMO

## 2020-11-22 DIAGNOSIS — I639 Cerebral infarction, unspecified: Secondary | ICD-10-CM | POA: Diagnosis not present

## 2020-11-29 NOTE — Progress Notes (Signed)
Carelink Summary Report / Loop Recorder 

## 2020-12-27 ENCOUNTER — Ambulatory Visit (INDEPENDENT_AMBULATORY_CARE_PROVIDER_SITE_OTHER): Payer: Medicare HMO

## 2020-12-27 DIAGNOSIS — I639 Cerebral infarction, unspecified: Secondary | ICD-10-CM

## 2020-12-28 LAB — CUP PACEART REMOTE DEVICE CHECK
Date Time Interrogation Session: 20221212012652
Implantable Pulse Generator Implant Date: 20190605

## 2021-01-05 NOTE — Progress Notes (Signed)
Carelink Summary Report / Loop Recorder 

## 2021-01-16 NOTE — Progress Notes (Signed)
Cardiology Office Note Date:  01/19/2021  Patient ID:  Jon Hughes, Jon Hughes April 16, 1935, MRN [id number] PCP:  Josetta Huddle, MD  Electrophysiologist: Dr. Caryl Comes    Chief Complaint:  afib on monitor  History of Present Illness: Jon Hughes is a 86 y.o. male with history of anxiety/depression, HLD, prostate ca, stroke > loop  He comes in today to be seen for Dr. Caryl Comes, last seen by him 2020, at that time doing well, no AFib.  12/20/20 remoted noted + AFib episode and called to come in.  TODAY He feels well Mentions no cardiac awareness of any palpitations He mentions a CP that he has had "forever" that he has never really given any thought to, has a hard time describing it, maybe a "catch", it is random, rare and dates back perhaps years, maybe correleated with times of peak stress.  He is pretty vague  He reports he was visiting his brother over the holidays and New years eve had about 10 minutes of word finding difficulty "Exactly like before" He did not get evaluated or seek attention, has not happened again He denies any hx of near syncope or syncope No hematological or bleeding history    Device information MDT LINQ implant 06/13/2017 for cryptogenic stroke   Past Medical History:  Diagnosis Date   Cardiac murmur    History of tachycardia    Hx of anxiety disorder    Hx of major depression    Hyperlipidemia    Malignant neoplasm of prostate ([place]) 03/12/2014   Prostate cancer ([place])    Stroke ([place])    TIA (transient ischemic attack) 04/05/2017    Past Surgical History:  Procedure Laterality Date   HERNIA REPAIR     LOOP RECORDER INSERTION N/A 06/13/2017   Procedure: LOOP RECORDER INSERTION;  Surgeon: Deboraha Sprang, MD;  Location: [facility];  Service: Cardiovascular;  Laterality: N/A;   PROSTATE BIOPSY      Current Outpatient Medications  Medication Sig Dispense Refill   aspirin EC 81 MG tablet Take 81 mg by mouth daily.     atorvastatin (LIPITOR)  80 MG tablet Take 1 tablet (80 mg total) by mouth daily at 6 PM. (Patient taking differently: Take 80 mg by mouth daily at 6 PM. 1/2 tablet in the morning.) 90 tablet 3   b complex vitamins capsule Take 1 capsule by mouth daily.     ibuprofen (ADVIL,MOTRIN) 200 MG tablet Take 200-400 mg by mouth daily as needed for headache or moderate pain.     metoprolol succinate (TOPROL-XL) 25 MG 24 hr tablet Take 25 mg by mouth daily.     OVER THE COUNTER MEDICATION Take 1 Dose by mouth daily. OPC-3 anti oxidant     PARoxetine (PAXIL) 20 MG tablet Take 1.5 tablets (30 mg total) by mouth daily. 135 tablet 3   Psyllium (METAMUCIL PO) Take 1 Dose by mouth daily.     tamsulosin (FLOMAX) 0.4 MG CAPS capsule Take 0.4 mg by mouth daily.      VITAMIN D PO Take by mouth.     No current facility-administered medications for this visit.    Allergies:   Codeine and Sulfa antibiotics   Social History:  The patient  reports that he has quit smoking. His smoking use included cigarettes. He has a 8.00 pack-year smoking history. He has never used smokeless tobacco. He reports that he does not drink alcohol and does not use drugs.   Family History:  The  patient's family history includes Breast cancer in his sister; Liver cancer in his mother; Multiple myeloma in his sister; Throat cancer in his brother.  ROS:  Please see the history of present illness.    All other systems are reviewed and otherwise negative.   PHYSICAL EXAM:  VS:  BP (!) 152/80    Pulse 75    Ht 5' 11"  (1.803 m)    Wt 159 lb 3.2 oz (72.2 kg)    SpO2 92%    BMI 22.20 kg/m  BMI: Body mass index is 22.2 kg/m. Well nourished, well developed, in no acute distress HEENT: normocephalic, atraumatic Neck: no JVD, carotid bruits or masses Cardiac:  RRR; no significant murmurs, no rubs, or gallops Lungs:  CTA b/l, no wheezing, rhonchi or rales Abd: soft, nontender MS: no deformity, age appropriate atrophy Ext: no edema Skin: warm and dry, no rash Neuro:   No gross deficits appreciated Psych: euthymic mood, full affect  ILR site is stable, no tethering or discomfort   EKG:  Done today and reviewed by myself shows  SR 75bpm, PAC  Device interrogation done today and reviewed by myself:  Battery is good 3 Afib episodes logged, 1 tachy All available EGMs reviewed Some are noisy and difficult to tell, the episode 12/20 though appears clearly to be Afib w/RVR   04/06/2017: TTE Left ventricle: The cavity size was normal. Wall thickness was    increased in a pattern of mild LVH. Systolic function was normal.    The estimated ejection fraction was in the range of 60% to 65%.    Wall motion was normal; there were no regional wall motion    abnormalities. Doppler parameters are consistent with abnormal    left ventricular relaxation (grade 1 diastolic dysfunction). The    E/e&' ratio is <8, suggesting normal LV filling pressure.  - Mitral valve: Mildly thickened leaflets . There was mild    regurgitation.  - Left atrium: The atrium was normal in size.  - Tricuspid valve: There was mild regurgitation.  - Pulmonic valve: There was mild regurgitation.  - Pulmonary arteries: PA peak pressure: 30 mm Hg (S).  - Inferior vena cava: The vessel was normal in size. The    respirophasic diameter changes were in the normal range (>= 50%),    consistent with normal central venous pressure.   Impressions:   - LVEF 60-65%, mild LVH, normal wall motion, grade 1 DD, normal LV    filling pressure, mild MR, normal LA size, mild TR, mild PI, RVSP    30 mmHg, normal IVC.   Recent Labs: No results found for requested labs within last 8760 hours.  No results found for requested labs within last 8760 hours.   CrCl cannot be calculated (Patient's most recent lab result is older than the maximum 21 days allowed.).   Wt Readings from Last 3 Encounters:  01/19/21 159 lb 3.2 oz (72.2 kg)  12/14/17 162 lb 12.8 oz (73.8 kg)  06/13/17 160 lb (72.6 kg)      Other studies reviewed: Additional studies/records reviewed today include: summarized above  ASSESSMENT AND PLAN:  Cryptogenic stroke > loop 2. Newly found Afib CHA2DS2Vasc is 4  We dicussed at length what AFib is What his risk score is and what that means Discussed rational for anticoagulation, risks/benefits of [place]  He had what sounds of TIA 12/31  Will get labs today He would like to see his PMD 1st and revisit with [place] decision on blood  thinner  BP is up some, he will keep an eye on it at home and f/u with his PMD  Disposition: F/u with [place] in 2 weeks, sooner if needed  Current medicines are reviewed at length with the patient today.  The patient did not have any concerns regarding medicines.  Venetia Night, PA-C 01/19/2021 10:51 AM     [facility] [address] [place] [address] [phone] (office)  [phone] (fax)

## 2021-01-19 ENCOUNTER — Ambulatory Visit: Payer: Medicare HMO | Admitting: Physician Assistant

## 2021-01-19 ENCOUNTER — Other Ambulatory Visit: Payer: Self-pay

## 2021-01-19 ENCOUNTER — Encounter: Payer: Self-pay | Admitting: Physician Assistant

## 2021-01-19 VITALS — BP 152/80 | HR 75 | Ht 71.0 in | Wt 159.2 lb

## 2021-01-19 DIAGNOSIS — Z4509 Encounter for adjustment and management of other cardiac device: Secondary | ICD-10-CM

## 2021-01-19 DIAGNOSIS — I480 Paroxysmal atrial fibrillation: Secondary | ICD-10-CM | POA: Diagnosis not present

## 2021-01-19 DIAGNOSIS — Z79899 Other long term (current) drug therapy: Secondary | ICD-10-CM

## 2021-01-19 LAB — BASIC METABOLIC PANEL
BUN/Creatinine Ratio: 13 (ref 10–24)
BUN: 13 mg/dL (ref 8–27)
CO2: 27 mmol/L (ref 20–29)
Calcium: 10.2 mg/dL (ref 8.6–10.2)
Chloride: 100 mmol/L (ref 96–106)
Creatinine, Ser: 0.99 mg/dL (ref 0.76–1.27)
Glucose: 67 mg/dL — ABNORMAL LOW (ref 70–99)
Potassium: 4.3 mmol/L (ref 3.5–5.2)
Sodium: 139 mmol/L (ref 134–144)
eGFR: 76 mL/min/{1.73_m2} (ref >59)

## 2021-01-19 LAB — CUP PACEART INCLINIC DEVICE CHECK
Date Time Interrogation Session: 20230111110502
Implantable Pulse Generator Implant Date: 20190605

## 2021-01-19 LAB — CBC
Hematocrit: 44 % (ref 37.5–51.0)
Hemoglobin: 14.6 g/dL (ref 13.0–17.7)
MCH: 30.9 pg (ref 26.6–33.0)
MCHC: 33.2 g/dL (ref 31.5–35.7)
MCV: 93 fL (ref 79–97)
Platelets: 272 10*3/uL (ref 150–450)
RBC: 4.72 x10E6/uL (ref 4.14–5.80)
RDW: 12.3 % (ref 11.6–15.4)
WBC: 5.5 10*3/uL (ref 3.4–10.8)

## 2021-01-19 NOTE — Patient Instructions (Addendum)
Medication Instructions:   Your physician recommends that you continue on your current medications as directed. Please refer to the Current Medication list given to you today.   *If you need a refill on your cardiac medications before your next appointment, please call your pharmacy*   Lab Work: BMET AND CBC TODAY   If you have labs (blood work) drawn today and your tests are completely normal, you will receive your results only by: [place] (if you have MyChart) OR A paper copy in the mail If you have any lab test that is abnormal or we need to change your treatment, we will call you to review the results.   Testing/Procedures: NONE ORDERED  TODAY   Follow-Up: At [facility], you and your health needs are our priority.  As part of our continuing mission to provide you with exceptional heart care, we have created designated Provider Care Teams.  These Care Teams include your primary Cardiologist (physician) and Advanced Practice Providers (APPs -  Physician Assistants and Nurse Practitioners) who all work together to provide you with the care you need, when you need it.  We recommend signing up for the patient portal called "MyChart".  Sign up information is provided on this After Visit Summary.  MyChart is used to connect with patients for Virtual Visits (Telemedicine).  Patients are able to view lab/test results, encounter notes, upcoming appointments, etc.  Non-urgent messages can be sent to your provider as well.   To learn more about what you can do with MyChart, go to [email or web address].    Your next appointment:   2 week(s)  The format for your next appointment:   In Person  Provider:   Tommye Standard, PA-C    Other Instructions

## 2021-01-24 ENCOUNTER — Telehealth: Payer: Self-pay | Admitting: *Deleted

## 2021-01-24 NOTE — Telephone Encounter (Signed)
Lvm of results and clinic number to cal back if any questions or concerns

## 2021-01-30 NOTE — Progress Notes (Signed)
Cardiology Office Note Date:  02/02/2021  Patient ID:  Jon, Hughes 1935-08-29, MRN [id number] PCP:  Josetta Huddle, MD  Electrophysiologist: Dr. Caryl Comes    Chief Complaint:   planned f/u  History of Present Illness: Jon Hughes is a 86 y.o. male with history of anxiety/depression, HLD, prostate ca, stroke > loop  He comes in today to be seen for Dr. Caryl Comes, last seen by him 2020, at that time doing well, no AFib.  12/20/20 remoted noted + AFib episode and called to come in.  I aw him 01/19/21 He feels well Mentions no cardiac awareness of any palpitations He mentions a CP that he has had "forever" that he has never really given any thought to, has a hard time describing it, maybe a "catch", it is random, rare and dates back perhaps years, maybe correleated with times of peak stress.  He is pretty vague  He reports he was visiting his brother over the holidays and New years eve had about 10 minutes of word finding difficulty "Exactly like before" He did not get evaluated or seek attention, has not happened again He denies any hx of near syncope or syncope No hematological or bleeding history  We discussed AFib, rational for a/c as well as risks, benefits.  He was undecided and wanted to d/w his PMD prior to starting A/C  TODAY He feels well, no recurrent TIA-like or neurological symptoms He did have an opportunity to talk with [name] who he said was in agreement with a/c. He denies any CP, palpitations or cardiac awareness No near syncope or syncope  He mentions he is a little "down", grieving his wife's death.   Device information MDT LINQ implant 06/13/2017 for cryptogenic stroke   Past Medical History:  Diagnosis Date   Cardiac murmur    History of tachycardia    Hx of anxiety disorder    Hx of major depression    Hyperlipidemia    Malignant neoplasm of prostate ([facility]) 03/12/2014   Prostate cancer ([facility])    Stroke ([facility])    TIA (transient ischemic  attack) 04/05/2017    Past Surgical History:  Procedure Laterality Date   HERNIA REPAIR     LOOP RECORDER INSERTION N/A 06/13/2017   Procedure: LOOP RECORDER INSERTION;  Surgeon: Deboraha Sprang, MD;  Location: [facility];  Service: Cardiovascular;  Laterality: N/A;   PROSTATE BIOPSY      Current Outpatient Medications  Medication Sig Dispense Refill   atorvastatin (LIPITOR) 80 MG tablet Take 1 tablet (80 mg total) by mouth daily at 6 PM. 90 tablet 3   b complex vitamins capsule Take 1 capsule by mouth daily.     ibuprofen (ADVIL,MOTRIN) 200 MG tablet Take 200-400 mg by mouth daily as needed for headache or moderate pain.     metoprolol succinate (TOPROL-XL) 25 MG 24 hr tablet Take 25 mg by mouth daily.     OVER THE COUNTER MEDICATION Take 1 Dose by mouth daily. OPC-3 anti oxidant     PARoxetine (PAXIL) 20 MG tablet Take 1.5 tablets (30 mg total) by mouth daily. (Patient taking differently: Take 40 mg by mouth daily.) 135 tablet 3   Psyllium (METAMUCIL PO) Take 1 Dose by mouth daily.     tamsulosin (FLOMAX) 0.4 MG CAPS capsule Take 0.4 mg by mouth daily.      VITAMIN D PO Take by mouth.     No current facility-administered medications for this visit.  Allergies:   Codeine and Sulfa antibiotics   Social History:  The patient  reports that he has quit smoking. His smoking use included cigarettes. He has a 8.00 pack-year smoking history. He has never used smokeless tobacco. He reports that he does not drink alcohol and does not use drugs.   Family History:  The patient's family history includes Breast cancer in his sister; Liver cancer in his mother; Multiple myeloma in his sister; Throat cancer in his brother.  ROS:  Please see the history of present illness.    All other systems are reviewed and otherwise negative.   PHYSICAL EXAM:  VS:  BP 136/68    Pulse 71    Ht 5' 11"  (1.803 m)    Wt 157 lb 3.2 oz (71.3 kg)    SpO2 92%    BMI 21.92 kg/m  BMI: Body mass index is 21.92  kg/m. Well nourished, well developed, in no acute distress HEENT: normocephalic, atraumatic Neck: no JVD, carotid bruits or masses Cardiac:   RRR; no significant murmurs, no rubs, or gallops Lungs:  CTA b/l, no wheezing, rhonchi or rales Abd: soft, nontender MS: no deformity, age appropriate atrophy Ext:  no edema Skin: warm and dry, no rash Neuro:  No gross deficits appreciated Psych: euthymic mood, full affect  ILR site is stable, no tethering or discomfort   EKG:  not done today  Device interrogation done today and reviewed by myself:  Battery is good No new episodes R waves 0.80m   04/06/2017: TTE Left ventricle: The cavity size was normal. Wall thickness was    increased in a pattern of mild LVH. Systolic function was normal.    The estimated ejection fraction was in the range of 60% to 65%.    Wall motion was normal; there were no regional wall motion    abnormalities. Doppler parameters are consistent with abnormal    left ventricular relaxation (grade 1 diastolic dysfunction). The    E/e&' ratio is <8, suggesting normal LV filling pressure.  - Mitral valve: Mildly thickened leaflets . There was mild    regurgitation.  - Left atrium: The atrium was normal in size.  - Tricuspid valve: There was mild regurgitation.  - Pulmonic valve: There was mild regurgitation.  - Pulmonary arteries: PA peak pressure: 30 mm Hg (S).  - Inferior vena cava: The vessel was normal in size. The    respirophasic diameter changes were in the normal range (>= 50%),    consistent with normal central venous pressure.   Impressions:   - LVEF 60-65%, mild LVH, normal wall motion, grade 1 DD, normal LV    filling pressure, mild MR, normal LA size, mild TR, mild PI, RVSP    30 mmHg, normal IVC.   Recent Labs: 01/19/2021: BUN 13; Creatinine, Ser 0.99; Hemoglobin 14.6; Platelets 272; Potassium 4.3; Sodium 139  No results found for requested labs within last 8760 hours.   Estimated Creatinine  Clearance: 56 mL/min (by C-G formula based on SCr of 0.99 mg/dL).   Wt Readings from Last 3 Encounters:  02/02/21 157 lb 3.2 oz (71.3 kg)  01/19/21 159 lb 3.2 oz (72.2 kg)  12/14/17 162 lb 12.8 oz (73.8 kg)     Other studies reviewed: Additional studies/records reviewed today include: summarized above  ASSESSMENT AND PLAN:  Cryptogenic stroke > loop 2. Newly found Afib CHA2DS2Vasc is 4 We re-visited AFib, hx of TIA (s) and rational for [place]benefit of [place] agreeable to  pursue [place] tx  Will stop ASA, start Eliqus 44m BID Discussed to notify of bleeding or signs of bleeding    Disposition: I will see him back in 4-6 weeks, sooner if needed  Current medicines are reviewed at length with the patient today.  The patient did not have any concerns regarding medicines.  SVenetia Night PA-C 02/02/2021 11:05 AM     [facility] [address] [place] [phone](office)  [phone](fax)

## 2021-01-31 ENCOUNTER — Ambulatory Visit (INDEPENDENT_AMBULATORY_CARE_PROVIDER_SITE_OTHER): Payer: Medicare HMO

## 2021-01-31 DIAGNOSIS — I639 Cerebral infarction, unspecified: Secondary | ICD-10-CM | POA: Diagnosis not present

## 2021-01-31 LAB — CUP PACEART REMOTE DEVICE CHECK
Date Time Interrogation Session: 20230122234140
Implantable Pulse Generator Implant Date: 20190605

## 2021-02-02 ENCOUNTER — Ambulatory Visit: Payer: Medicare HMO | Admitting: Physician Assistant

## 2021-02-02 ENCOUNTER — Encounter: Payer: Self-pay | Admitting: Physician Assistant

## 2021-02-02 ENCOUNTER — Other Ambulatory Visit: Payer: Self-pay

## 2021-02-02 VITALS — BP 136/68 | HR 71 | Ht 71.0 in | Wt 157.2 lb

## 2021-02-02 DIAGNOSIS — I480 Paroxysmal atrial fibrillation: Secondary | ICD-10-CM | POA: Diagnosis not present

## 2021-02-02 DIAGNOSIS — Z4509 Encounter for adjustment and management of other cardiac device: Secondary | ICD-10-CM

## 2021-02-02 LAB — CUP PACEART INCLINIC DEVICE CHECK
Date Time Interrogation Session: 20230125125207
Implantable Pulse Generator Implant Date: 20190605

## 2021-02-02 MED ORDER — APIXABAN 5 MG PO TABS
5.0000 mg | ORAL_TABLET | Freq: Two times a day (BID) | ORAL | 1 refills | Status: DC
Start: 2021-02-02 — End: 2021-06-23

## 2021-02-02 NOTE — Patient Instructions (Addendum)
Medication Instructions:    STOP TAKING ASPRIN 81 MG   START TAKING ELIQUIS 5 MG TWICE A DAY   *If you need a refill on your cardiac medications before your next appointment, please call your pharmacy*   Lab Work:   NONE ORDERED  TODAY   If you have labs (blood work) drawn today and your tests are completely normal, you will receive your results only by: [place] (if you have MyChart) OR A paper copy in the mail If you have any lab test that is abnormal or we need to change your treatment, we will call you to review the results.   Testing/Procedures: NONE ORDERED  TODAY     Follow-Up: At [facility], you and your health needs are our priority.  As part of our continuing mission to provide you with exceptional heart care, we have created designated Provider Care Teams.  These Care Teams include your primary Cardiologist (physician) and Advanced Practice Providers (APPs -  Physician Assistants and Nurse Practitioners) who all work together to provide you with the care you need, when you need it.  We recommend signing up for the patient portal called "MyChart".  Sign up information is provided on this After Visit Summary.  MyChart is used to connect with patients for Virtual Visits (Telemedicine).  Patients are able to view lab/test results, encounter notes, upcoming appointments, etc.  Non-urgent messages can be sent to your provider as well.   To learn more about what you can do with MyChart, go to [email or web address].    Your next appointment:   4-6 week(s)  The format for your next appointment:   In Person  Provider:   Tommye Standard, PA-C    Other Instructions

## 2021-02-08 ENCOUNTER — Encounter: Payer: Self-pay | Admitting: Internal Medicine

## 2021-02-08 ENCOUNTER — Telehealth: Payer: Self-pay | Admitting: Physician Assistant

## 2021-02-08 NOTE — Telephone Encounter (Signed)
Pt c/o medication issue:  1. Name of Medication: apixaban (ELIQUIS) 5 MG TABS tablet  2. How are you currently taking this medication (dosage and times per day)? As directed  3. Are you having a reaction (difficulty breathing--STAT)? Diarrhea   4. What is your medication issue? Patient said he did not have this issue before starting this medication. He can not leave the house for how often he needs to use the restroom    The patient is a survivor of Prostate cancer. He has had bowel issues as a result but it was controllable until he started this medication

## 2021-02-08 NOTE — Telephone Encounter (Signed)
Called and spoke at length to patient about diarrhea. Pt denies any blood in stool. States that diarrhea began just after starting Eliquis. Pt states that due to his history of prostate cancer, he received radiation 5 years ago and has had bowel incontinence since. Pt verbalized that he uses Culturelle probiotic (10 billion cfu) once daily and states he takes a "whopping dose" of metamucil. Pt endorses wearing panty liners to help control his anal seepage which he describes is the consistency of chocolate pudding. States that all of this has worsened since beginning Eliquis. I counseled the patient that Metamucil is a bulk forming laxative and it's likely contributing to his diarrhea, advised pt to discontinue and see if this helps. Per [employer] instruction, informed pt that he should see his PCP for diarrhea as this is likely not coming from his Eliquis and may point to another issue. Pt agrees with plan and understands that it's important he continue on [place] due to his A-fib. He will call if symptoms worsen or blood appears.

## 2021-02-10 NOTE — Progress Notes (Signed)
Carelink Summary Report / Loop Recorder 

## 2021-02-15 DIAGNOSIS — Z8546 Personal history of malignant neoplasm of prostate: Secondary | ICD-10-CM | POA: Diagnosis not present

## 2021-02-22 DIAGNOSIS — N5201 Erectile dysfunction due to arterial insufficiency: Secondary | ICD-10-CM | POA: Diagnosis not present

## 2021-02-22 DIAGNOSIS — R3915 Urgency of urination: Secondary | ICD-10-CM | POA: Diagnosis not present

## 2021-02-22 DIAGNOSIS — Z8546 Personal history of malignant neoplasm of prostate: Secondary | ICD-10-CM | POA: Diagnosis not present

## 2021-03-04 LAB — CUP PACEART REMOTE DEVICE CHECK
Date Time Interrogation Session: 20230224234148
Implantable Pulse Generator Implant Date: 20190605

## 2021-03-07 ENCOUNTER — Ambulatory Visit (INDEPENDENT_AMBULATORY_CARE_PROVIDER_SITE_OTHER): Payer: Medicare HMO

## 2021-03-07 DIAGNOSIS — I480 Paroxysmal atrial fibrillation: Secondary | ICD-10-CM

## 2021-03-10 ENCOUNTER — Other Ambulatory Visit: Payer: Self-pay

## 2021-03-10 ENCOUNTER — Ambulatory Visit: Payer: Medicare HMO | Admitting: Psychiatry

## 2021-03-10 ENCOUNTER — Encounter: Payer: Self-pay | Admitting: Psychiatry

## 2021-03-10 DIAGNOSIS — F4001 Agoraphobia with panic disorder: Secondary | ICD-10-CM

## 2021-03-10 DIAGNOSIS — F3342 Major depressive disorder, recurrent, in full remission: Secondary | ICD-10-CM | POA: Diagnosis not present

## 2021-03-10 DIAGNOSIS — F411 Generalized anxiety disorder: Secondary | ICD-10-CM

## 2021-03-10 DIAGNOSIS — R69 Illness, unspecified: Secondary | ICD-10-CM | POA: Diagnosis not present

## 2021-03-10 MED ORDER — PAROXETINE HCL 20 MG PO TABS: 40.0000 mg | ORAL_TABLET | Freq: Every day | ORAL | 3 refills | Status: DC

## 2021-03-10 NOTE — Progress Notes (Signed)
Jon Hughes [id number] 1935-04-07 86 y.o.  Subjective:   Patient ID:  Jon Hughes is a 86 y.o. (DOB 12-May-1935) male.  Chief Complaint:  Chief Complaint  Patient presents with   Follow-up   Recurrent major depression in full remission   Stress    Depression        Jon Hughes presents to the office today for follow-up of depression and anxiety.  He did have a TIA on April 05, 2017 with extensive neurologic and cardiac work-up which did not reveal the precise cause.  There was no residual damage.  He has been given a heart monitor since that time and there have been no unusual occurrences.  He was doing well March 2020 at his last visit.  No meds were changed.  Still goo mood.  Covid has been good to him. For awhile he was listless and couldn't do [employer].   But lately taken more active stance. Has developed, created [employer].   Patient reports stable mood and denies depressed or irritable moods. Typically enthusiastic. Not aware of manias.   Patient denies any recent difficulty with anxiety.  Patient denies difficulty with sleep initiation or maintenance. Denies appetite disturbance.  Patient reports that energy and motivation have been good.  Patient denies any difficulty with concentration.  Patient denies any suicidal ideation. He and his wife have less struggle financially due to [employer] and  are otherwise doing okay. Sons and family are doing well.     03/20/20 appt noted: Wife died of cancer.  Still involved in AA.  Has some supportive people.   Dealing with missing wife, Jon Hughes.  Sister Jon Hughes died.  Struggling with grief.  Will start grief group. Found out he was adopted when he was a teenager. AA helped him work through anger at his parents.  Bio father he never met due to his death.   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  [employer] Party.   Sober for years.  Passionate and engaged in faith.  Better getting OOB after  increase paroxetine to 30 mg daily.  03/10/21 appt noted: 2nd visit since wife died.  Did Hospice counseling.  Helped.  Still misses her loss.  Does have a GF.   Increased paroxetine to 40 mg fall 2022 bc didn't feel good.  Feels it helped with depression  feelings.  Was having trouble going to sleep.  Started metoprolol which helped. GF is off and on and creates some stress. Uses prayer and meditation to help himself. Also still doing Palmer presentation. No problems with paroxetine. Sister died and left him money.  Past psychiatric meds.   He has been under my psychiatric care since 1997 for major depression and generalized anxiety disorder with panic.   Most of that time is been on paroxetine with good response.   His last significant relapse was in 2014.  He failed to respond to citalopram at that time.   Did respond to brief potentiation with Abilify on Paxil 30 mg.   Since then he has been maintained on Paxil 20 mg effectively until wife died.. Failed alternatives to paroxetine  He has remote history of alcohol dependence but has been sober and active in [place] for 29 years  Under [place] for 24 years.  Review of Systems:  Review of Systems  Cardiovascular:  Negative for chest pain and palpitations.  Musculoskeletal:  Positive for arthralgias.  Neurological:  Negative for dizziness, tremors and weakness.  Psychiatric/Behavioral:  Positive for depression.    Medications: I have reviewed the patient's current medications.  Current Outpatient Medications  Medication Sig Dispense Refill   apixaban (ELIQUIS) 5 MG TABS tablet Take 1 tablet (5 mg total) by mouth 2 (two) times daily. 180 tablet 1   atorvastatin (LIPITOR) 80 MG tablet Take 1 tablet (80 mg total) by mouth daily at 6 PM. 90 tablet 3   b complex vitamins capsule Take 1 capsule by mouth daily.     metoprolol succinate (TOPROL-XL) 25 MG 24 hr tablet Take 25 mg by mouth daily.     Multiple Vitamins-Minerals  (CENTRUM SILVER PO) Take by mouth. Powder from [place] Take 1 Dose by mouth daily. OPC-3 anti oxidant     PARoxetine (PAXIL) 20 MG tablet Take 1.5 tablets (30 mg total) by mouth daily. (Patient taking differently: Take 40 mg by mouth daily.) 135 tablet 3   tamsulosin (FLOMAX) 0.4 MG CAPS capsule Take 0.4 mg by mouth daily.      UNABLE TO FIND Med Name: CBD oil, takes 1 droper.     VITAMIN D PO Take by mouth.     Psyllium (METAMUCIL PO) Take 1 Dose by mouth daily.     No current facility-administered medications for this visit.    Medication Side Effects: SE some sexual at higher dose.  Allergies:  Allergies  Allergen Reactions   Codeine Nausea Only   Sulfa Antibiotics Rash    while in the Military    Past Medical History:  Diagnosis Date   Cardiac murmur    History of tachycardia    Hx of anxiety disorder    Hx of major depression    Hyperlipidemia    Malignant neoplasm of prostate ([facility]) 03/12/2014   Prostate cancer ([place])    Stroke ([facility])    TIA (transient ischemic attack) 04/05/2017    Family History  Problem Relation Age of Onset   Liver cancer Mother    Breast cancer Sister    Multiple myeloma Sister    Throat cancer Brother     Social History   Socioeconomic History   Marital status: Divorced    Spouse name: Not on file   Number of children: 2   Years of education: Not on file   Highest education level: Not on file  Occupational History   Occupation: Marketing  Tobacco Use   Smoking status: Former    Packs/day: 1.00    Years: 8.00    Pack years: 8.00    Types: Cigarettes   Smokeless tobacco: Never  Substance and Sexual Activity   Alcohol use: No    Alcohol/week: 0.0 standard drinks   Drug use: No   Sexual activity: Not on file  Other Topics Concern   Not on file  Social History Narrative   Not on file   Social Determinants of Health   Financial Resource Strain: Not on file  Food Insecurity: Not on file   Transportation Needs: Not on file  Physical Activity: Not on file  Stress: Not on file  Social Connections: Not on file  Intimate Partner Violence: Not on file    Past Medical History, Surgical history, Social history, and Family history were reviewed and updated as appropriate.   Please see review of systems for further details on the patient's review from today.   Objective:   Physical Exam:  There were no vitals taken for this visit.  Physical Exam Constitutional:  General: He is not in acute distress.    Appearance: He is well-developed.  Musculoskeletal:        General: No deformity.  Neurological:     Mental Status: He is alert and oriented to person, place, and time.     Coordination: Coordination normal.  Psychiatric:        Attention and Perception: Attention normal. He is attentive.        Mood and Affect: Mood normal. Mood is not anxious or depressed. Affect is not labile, blunt, angry, tearful or inappropriate.        Speech: Speech normal.        Behavior: Behavior normal.        Thought Content: Thought content normal. Thought content is not delusional. Thought content does not include homicidal or suicidal ideation. Thought content does not include suicidal plan.        Cognition and Memory: Cognition normal.        Judgment: Judgment normal.     Comments: Insight is good. Talkative and pleasant.    Lab Review:     Component Value Date/Time   NA 139 01/19/2021 1002   K 4.3 01/19/2021 1002   CL 100 01/19/2021 1002   CO2 27 01/19/2021 1002   GLUCOSE 67 (L) 01/19/2021 1002   GLUCOSE 98 04/06/2017 0225   BUN 13 01/19/2021 1002   CREATININE 0.99 01/19/2021 1002   CALCIUM 10.2 01/19/2021 1002   PROT 6.7 04/05/2017 1615   ALBUMIN 3.8 04/05/2017 1615   AST 21 04/05/2017 1615   ALT 16 (L) 04/05/2017 1615   ALKPHOS 78 04/05/2017 1615   BILITOT 0.7 04/05/2017 1615   GFRNONAA >60 04/06/2017 0225   GFRAA >60 04/06/2017 0225       Component Value  Date/Time   WBC 5.5 01/19/2021 1002   WBC 5.8 04/06/2017 0225   RBC 4.72 01/19/2021 1002   RBC 4.19 (L) 04/06/2017 0225   HGB 14.6 01/19/2021 1002   HCT 44.0 01/19/2021 1002   PLT 272 01/19/2021 1002   MCV 93 01/19/2021 1002   MCH 30.9 01/19/2021 1002   MCH 30.8 04/06/2017 0225   MCHC 33.2 01/19/2021 1002   MCHC 32.3 04/06/2017 0225   RDW 12.3 01/19/2021 1002   LYMPHSABS 1.1 04/05/2017 1615   MONOABS 0.4 04/05/2017 1615   EOSABS 0.1 04/05/2017 1615   BASOSABS 0.0 04/05/2017 1615    No results found for: POCLITH, LITHIUM   No results found for: PHENYTOIN, PHENOBARB, VALPROATE, CBMZ   .res Assessment: Plan:    Recurrent major depression in full remission ([place])  Generalized anxiety disorder  Panic disorder with agoraphobia Grief disc this in detail and difference s with depression  Continued good response on paroxetine and better on 40 mg a day with resolution of his depression and anxiety disorders for several months.  He is still dealing with grief. He is tolerating the medications well.  No indication for change.  He is tolerating the meds.  No evidence of mania. Disc pros/cons of reductions Failed alternatives to paroxetine.  He remains sober and committed to [place]  Per his request we will follow-up 1 year.  Lynder Parents MD, DFAPA Please see After Visit Summary for patient specific instructions.  Future Appointments  Date Time Provider [place]  03/15/2021 10:05 AM Baldwin [place], PA-C [facility]  04/11/2021  7:05 AM [facility] DEVICE REMOTES [facility]  05/16/2021  7:05 AM [facility] DEVICE REMOTES [facility]  06/20/2021  7:05 AM [facility]  DEVICE REMOTES [facility]  07/25/2021  7:05 AM [facility] DEVICE REMOTES [facility]    No orders of the defined types were placed in this encounter.     -------------------------------

## 2021-03-10 NOTE — Progress Notes (Signed)
Carelink Summary Report / Loop Recorder 

## 2021-03-10 NOTE — Progress Notes (Deleted)
? ?Cardiology Office Note ?Date:  03/10/2021  ?Patient ID:  Jon Hughes, DOB 1935-11-13, MRN [id number] ?PCP:  Josetta Huddle, MD  ?Electrophysiologist: Dr. Caryl Comes ? ?  ?Chief Complaint:   planned f/u ? ?History of Present Illness: ?Jon Hughes is a 86 y.o. male with history of anxiety/depression, HLD, prostate ca, stroke > loop ? ?He comes in today to be seen for Dr. Caryl Comes, last seen by him 2020, at that time doing well, no AFib. ? ?12/20/20 remoted noted + AFib episode and called to come in. ? ?I saw him 01/19/21 ?He feels well ?Mentions no cardiac awareness of any palpitations ?He mentions a CP that he has had "forever" that he has never really given any thought to, has a hard time describing it, maybe a "catch", it is random, rare and dates back perhaps years, maybe correleated with times of peak stress.  He is pretty vague  ?He reports he was visiting his brother over the holidays and New years eve had about 10 minutes of word finding difficulty ?"Exactly like before" ?He did not get evaluated or seek attention, has not happened again ?He denies any hx of near syncope or syncope ?No hematological or bleeding history ? ?We discussed AFib, rational for a/c as well as risks, benefits.  He was undecided and wanted to d/w his PMD prior to starting A/C ? ?I saw him 02/02/21 ?He feels well, no recurrent TIA-like or neurological symptoms ?He did have an opportunity to talk with [name] who he said was in agreement with a/c. ?He denies any CP, palpitations or cardiac awareness ?No near syncope or syncope ?He mentions he is a little "down", grieving his wife's death. ?Was started on Eliquis, ASA stopped ? ?1/31: pt messaged that he developed diarrhea, concerned that it started after he started the eliquis, no blood reported, advised to see his PMD 1st prior to stopping the Eliquis ? ?*** bleeding, diarrhea ?*** CBC today ?*** burden ? ? ?Device information ?MDT LINQ implant 06/13/2017 for cryptogenic  stroke ? ? ?Past Medical History:  ?Diagnosis Date  ? Cardiac murmur   ? History of tachycardia   ? Hx of anxiety disorder   ? Hx of major depression   ? Hyperlipidemia   ? Malignant neoplasm of prostate ([place]) 03/12/2014  ? Prostate cancer ([place])   ? Stroke [facility])   ? TIA (transient ischemic attack) 04/05/2017  ? ? ?Past Surgical History:  ?Procedure Laterality Date  ? HERNIA REPAIR    ? LOOP RECORDER INSERTION N/A 06/13/2017  ? Procedure: LOOP RECORDER INSERTION;  Surgeon: Deboraha Sprang, MD;  Location: [facility];  Service: Cardiovascular;  Laterality: N/A;  ? PROSTATE BIOPSY    ? ? ?Current Outpatient Medications  ?Medication Sig Dispense Refill  ? apixaban (ELIQUIS) 5 MG TABS tablet Take 1 tablet (5 mg total) by mouth 2 (two) times daily. 180 tablet 1  ? atorvastatin (LIPITOR) 80 MG tablet Take 1 tablet (80 mg total) by mouth daily at 6 PM. 90 tablet 3  ? b complex vitamins capsule Take 1 capsule by mouth daily.    ? metoprolol succinate (TOPROL-XL) 25 MG 24 hr tablet Take 25 mg by mouth daily.    ? Multiple Vitamins-Minerals (CENTRUM SILVER PO) Take by mouth. Powder from [employer]    ? OVER THE COUNTER MEDICATION Take 1 Dose by mouth daily. OPC-3 anti oxidant    ? PARoxetine (PAXIL) 20 MG tablet Take 2 tablets (40 mg total) by  mouth daily. 180 tablet 3  ? Psyllium (METAMUCIL PO) Take 1 Dose by mouth daily.    ? tamsulosin (FLOMAX) 0.4 MG CAPS capsule Take 0.4 mg by mouth daily.     ? UNABLE TO FIND Med Name: CBD oil, takes 1 droper.    ? VITAMIN D PO Take by mouth.    ? ?No current facility-administered medications for this visit.  ? ? ?Allergies:   Codeine and Sulfa antibiotics  ? ?Social History:  The patient  reports that he has quit smoking. His smoking use included cigarettes. He has a 8.00 pack-year smoking history. He has never used smokeless tobacco. He reports that he does not drink alcohol and does not use drugs.  ? ?Family History:  The patient's family history includes Breast cancer in his  sister; Liver cancer in his mother; Multiple myeloma in his sister; Throat cancer in his brother. ? ?ROS:  Please see the history of present illness.    ?All other systems are reviewed and otherwise negative.  ? ?PHYSICAL EXAM:  ?VS:  There were no vitals taken for this visit. BMI: There is no height or weight on file to calculate BMI. ?Well nourished, well developed, in no acute distress ?HEENT: normocephalic, atraumatic ?Neck: no JVD, carotid bruits or masses ?Cardiac:   *** RRR; no significant murmurs, no rubs, or gallops ?Lungs:  *** CTA b/l, no wheezing, rhonchi or rales ?Abd: soft, nontender ?MS: no deformity, age appropriate atrophy ?Ext:  *** no edema ?Skin: warm and dry, no rash ?Neuro:  No gross deficits appreciated ?Psych: euthymic mood, full affect ? ?ILR site is stable, no tethering or discomfort ? ? ?EKG:  not done today ? ?Device interrogation done today and reviewed by myself:  ?*** ? ? ?04/06/2017: TTE ?Left ventricle: The cavity size was normal. Wall thickness was  ?  increased in a pattern of mild LVH. Systolic function was normal.  ?  The estimated ejection fraction was in the range of 60% to 65%.  ?  Wall motion was normal; there were no regional wall motion  ?  abnormalities. Doppler parameters are consistent with abnormal  ?  left ventricular relaxation (grade 1 diastolic dysfunction). The  ?  E/e&' ratio is <8, suggesting normal LV filling pressure.  ?- Mitral valve: Mildly thickened leaflets . There was mild  ?  regurgitation.  ?- Left atrium: The atrium was normal in size.  ?- Tricuspid valve: There was mild regurgitation.  ?- Pulmonic valve: There was mild regurgitation.  ?- Pulmonary arteries: PA peak pressure: 30 mm Hg (S).  ?- Inferior vena cava: The vessel was normal in size. The  ?  respirophasic diameter changes were in the normal range (>= 50%),  ?  consistent with normal central venous pressure.  ? ?Impressions:  ? ?- LVEF 60-65%, mild LVH, normal wall motion, grade 1 DD, normal LV   ?  filling pressure, mild MR, normal LA size, mild TR, mild PI, RVSP  ?  30 mmHg, normal IVC.  ? ?Recent Labs: ?01/19/2021: BUN 13; Creatinine, Ser 0.99; Hemoglobin 14.6; Platelets 272; Potassium 4.3; Sodium 139  ?No results found for requested labs within last 8760 hours.  ? ?CrCl cannot be calculated (Patient's most recent lab result is older than the maximum 21 days allowed.).  ? ?Wt Readings from Last 3 Encounters:  ?02/02/21 157 lb 3.2 oz (71.3 kg)  ?01/19/21 159 lb 3.2 oz (72.2 kg)  ?12/14/17 162 lb 12.8 oz (73.8 kg)  ?  ? ?  Other studies reviewed: ?Additional studies/records reviewed today include: summarized above ? ?ASSESSMENT AND PLAN: ? ?Cryptogenic stroke > loop ?2.   Newly found Afib ?CHA2DS2Vasc is 4, on *** Eliquis ? ? ? ?Disposition: I will see him back in 4-6 weeks, sooner if needed ? ?Current medicines are reviewed at length with the patient today.  The patient did not have any concerns regarding medicines. ? ?Signed, ?Tommye Standard, PA-C ?03/10/2021 3:03 PM    ? ?[facility] ?[address] ?[address] ?[place] [address] ?[phone] (office)  ?[phone] (fax) ? ? ?

## 2021-03-15 ENCOUNTER — Encounter: Payer: Medicare HMO | Admitting: Physician Assistant

## 2021-03-31 DIAGNOSIS — D1801 Hemangioma of skin and subcutaneous tissue: Secondary | ICD-10-CM | POA: Diagnosis not present

## 2021-03-31 DIAGNOSIS — L570 Actinic keratosis: Secondary | ICD-10-CM | POA: Diagnosis not present

## 2021-03-31 DIAGNOSIS — Z85828 Personal history of other malignant neoplasm of skin: Secondary | ICD-10-CM | POA: Diagnosis not present

## 2021-03-31 DIAGNOSIS — L814 Other melanin hyperpigmentation: Secondary | ICD-10-CM | POA: Diagnosis not present

## 2021-03-31 DIAGNOSIS — C44519 Basal cell carcinoma of skin of other part of trunk: Secondary | ICD-10-CM | POA: Diagnosis not present

## 2021-03-31 DIAGNOSIS — L309 Dermatitis, unspecified: Secondary | ICD-10-CM | POA: Diagnosis not present

## 2021-04-11 ENCOUNTER — Ambulatory Visit (INDEPENDENT_AMBULATORY_CARE_PROVIDER_SITE_OTHER): Payer: Medicare HMO

## 2021-04-11 DIAGNOSIS — I480 Paroxysmal atrial fibrillation: Secondary | ICD-10-CM | POA: Diagnosis not present

## 2021-04-11 LAB — CUP PACEART REMOTE DEVICE CHECK
Date Time Interrogation Session: 20230401001724
Implantable Pulse Generator Implant Date: 20190605

## 2021-04-14 NOTE — Progress Notes (Signed)
? ?Cardiology Office Note ?Date:  04/14/2021  ?Patient ID:  Jon Hughes, DOB [birth year], MRN [id number] ?PCP:  Josetta Huddle, MD  ?Electrophysiologist: Dr. Caryl Comes ? ?  ?Chief Complaint:    planned f/u ? ?History of Present Illness: ?Jon Hughes is a 86 y.o. male with history of anxiety/depression, HLD, prostate ca, stroke > loop ? ?He comes in today to be seen for Dr. Caryl Comes, last seen by him 2020, at that time doing well, no AFib. ? ?12/20/20 remoted noted + AFib episode and called to come in. ? ?I saw him 01/19/21 ?He feels well ?Mentions no cardiac awareness of any palpitations ?He mentions a CP that he has had "forever" that he has never really given any thought to, has a hard time describing it, maybe a "catch", it is random, rare and dates back perhaps years, maybe correleated with times of peak stress.  He is pretty vague  ?He reports he was visiting his brother over the holidays and New years eve had about 10 minutes of word finding difficulty ?"Exactly like before" ?He did not get evaluated or seek attention, has not happened again ?He denies any hx of near syncope or syncope ?No hematological or bleeding history ? ?We discussed AFib, rational for a/c as well as risks, benefits.  He was undecided and wanted to d/w his PMD prior to starting A/C ? ?I saw him 02/02/21 ?He feels well, no recurrent TIA-like or neurological symptoms ?He did have an opportunity to talk with [name] who he said was in agreement with a/c. ?He denies any CP, palpitations or cardiac awareness ?No near syncope or syncope ?He mentions he is a little "down", grieving his wife's death. ?Was started on Eliquis, ASA stopped ? ?1/31: pt messaged that he developed diarrhea, concerned that it started after he started the eliquis, no blood reported, advised to see his PMD 1st prior to stopping the Eliquis ? ?TODAY ?He is doing well ?No bleeding or signs of bleeding ?No CP, palpitations or cardiac awareness. ?If he has had any AFib he  didn't know. ?No dizzy spells, near syncope or syncope. ? ?Clarifies his diarrhea is not new for him, ut had a ferw days after starting Eliquis that was more then usual, and returned to his normal again. ?He had no concerns today ? ? ?Device information ?MDT LINQ implant 06/13/2017 for cryptogenic stroke ? ? ?Past Medical History:  ?Diagnosis Date  ? Cardiac murmur   ? History of tachycardia   ? Hx of anxiety disorder   ? Hx of major depression   ? Hyperlipidemia   ? Malignant neoplasm of prostate ([place]) 03/12/2014  ? Prostate cancer ([place])   ? Stroke [facility])   ? TIA (transient ischemic attack) 04/05/2017  ? ? ?Past Surgical History:  ?Procedure Laterality Date  ? HERNIA REPAIR    ? LOOP RECORDER INSERTION N/A 06/13/2017  ? Procedure: LOOP RECORDER INSERTION;  Surgeon: Deboraha Sprang, MD;  Location: [facility];  Service: Cardiovascular;  Laterality: N/A;  ? PROSTATE BIOPSY    ? ? ?Current Outpatient Medications  ?Medication Sig Dispense Refill  ? apixaban (ELIQUIS) 5 MG TABS tablet Take 1 tablet (5 mg total) by mouth 2 (two) times daily. 180 tablet 1  ? atorvastatin (LIPITOR) 80 MG tablet Take 1 tablet (80 mg total) by mouth daily at 6 PM. 90 tablet 3  ? b complex vitamins capsule Take 1 capsule by mouth daily.    ? metoprolol succinate (TOPROL-XL) 25  MG 24 hr tablet Take 25 mg by mouth daily.    ? Multiple Vitamins-Minerals (CENTRUM SILVER PO) Take by mouth. Powder from [employer]    ? OVER THE COUNTER MEDICATION Take 1 Dose by mouth daily. OPC-3 anti oxidant    ? PARoxetine (PAXIL) 20 MG tablet Take 2 tablets (40 mg total) by mouth daily. 180 tablet 3  ? Psyllium (METAMUCIL PO) Take 1 Dose by mouth daily.    ? tamsulosin (FLOMAX) 0.4 MG CAPS capsule Take 0.4 mg by mouth daily.     ? UNABLE TO FIND Med Name: CBD oil, takes 1 droper.    ? VITAMIN D PO Take by mouth.    ? ?No current facility-administered medications for this visit.  ? ? ?Allergies:   Codeine and Sulfa antibiotics  ? ?Social History:  The patient   reports that he has quit smoking. His smoking use included cigarettes. He has a 8.00 pack-year smoking history. He has never used smokeless tobacco. He reports that he does not drink alcohol and does not use drugs.  ? ?Family History:  The patient's family history includes Breast cancer in his sister; Liver cancer in his mother; Multiple myeloma in his sister; Throat cancer in his brother. ? ?ROS:  Please see the history of present illness.    ?All other systems are reviewed and otherwise negative.  ? ?PHYSICAL EXAM:  ?VS:  There were no vitals taken for this visit. BMI: There is no height or weight on file to calculate BMI. ?Well nourished, well developed, in no acute distress ?HEENT: normocephalic, atraumatic ?Neck: no JVD, carotid bruits or masses ?Cardiac:   RRR; no significant murmurs, no rubs, or gallops ?Lungs:  CTA b/l, no wheezing, rhonchi or rales ?Abd: soft, nontender ?MS: no deformity, age appropriate atrophy ?Ext:  no edema ?Skin: warm and dry, no rash ?Neuro:  No gross deficits appreciated ?Psych: euthymic mood, full affect ? ?ILR site is stable, no tethering or discomfort ? ? ?EKG:  not done today ? ?Device interrogation done today and reviewed by myself:  ?Battery good ?R waves 0.52m ?2 new AFib episodes ?Both 3/29, both 10 minutes, rate controlled. ?Some artifact on baseline difficult to see clearly, though is irregular ? ? ?04/06/2017: TTE ?Left ventricle: The cavity size was normal. Wall thickness was  ?  increased in a pattern of mild LVH. Systolic function was normal.  ?  The estimated ejection fraction was in the range of 60% to 65%.  ?  Wall motion was normal; there were no regional wall motion  ?  abnormalities. Doppler parameters are consistent with abnormal  ?  left ventricular relaxation (grade 1 diastolic dysfunction). The  ?  E/e&' ratio is <8, suggesting normal LV filling pressure.  ?- Mitral valve: Mildly thickened leaflets . There was mild  ?  regurgitation.  ?- Left atrium: The  atrium was normal in size.  ?- Tricuspid valve: There was mild regurgitation.  ?- Pulmonic valve: There was mild regurgitation.  ?- Pulmonary arteries: PA peak pressure: 30 mm Hg (S).  ?- Inferior vena cava: The vessel was normal in size. The  ?  respirophasic diameter changes were in the normal range (>= 50%),  ?  consistent with normal central venous pressure.  ? ?Impressions:  ? ?- LVEF 60-65%, mild LVH, normal wall motion, grade 1 DD, normal LV  ?  filling pressure, mild MR, normal LA size, mild TR, mild PI, RVSP  ?  30 mmHg, normal IVC.  ? ?  Recent Labs: ?01/19/2021: BUN 13; Creatinine, Ser 0.99; Hemoglobin 14.6; Platelets 272; Potassium 4.3; Sodium 139  ?No results found for requested labs within last 8760 hours.  ? ?CrCl cannot be calculated (Patient's most recent lab result is older than the maximum 21 days allowed.).  ? ?Wt Readings from Last 3 Encounters:  ?02/02/21 157 lb 3.2 oz (71.3 kg)  ?01/19/21 159 lb 3.2 oz (72.2 kg)  ?12/14/17 162 lb 12.8 oz (73.8 kg)  ?  ? ?Other studies reviewed: ?Additional studies/records reviewed today include: summarized above ? ?ASSESSMENT AND PLAN: ? ?Cryptogenic stroke > loop ?2.  Paroxysmal Afib ?CHA2DS2Vasc is 4, on Eliquis, appropriately dosed ?<0.1% burden ?CBC today ? ? ? ?Disposition: We will see him back in 74mo sooner if needed, should see MD next ? ?Current medicines are reviewed at length with the patient today.  The patient did not have any concerns regarding medicines. ? ?Signed, ?RTommye Standard PA-C ?04/14/2021 9:33 AM    ? ?[facility] ?[address] ?[place]?[phone] (office)  ?[phone](fax) ? ? ?

## 2021-04-15 ENCOUNTER — Encounter: Payer: Self-pay | Admitting: Physician Assistant

## 2021-04-15 ENCOUNTER — Ambulatory Visit: Payer: Medicare HMO | Admitting: Physician Assistant

## 2021-04-15 VITALS — BP 144/74 | HR 60 | Ht 71.0 in | Wt 158.0 lb

## 2021-04-15 DIAGNOSIS — I480 Paroxysmal atrial fibrillation: Secondary | ICD-10-CM | POA: Diagnosis not present

## 2021-04-15 DIAGNOSIS — I6330 Cerebral infarction due to thrombosis of unspecified cerebral artery: Secondary | ICD-10-CM

## 2021-04-15 DIAGNOSIS — Z4509 Encounter for adjustment and management of other cardiac device: Secondary | ICD-10-CM | POA: Diagnosis not present

## 2021-04-15 LAB — CUP PACEART INCLINIC DEVICE CHECK
Date Time Interrogation Session: 20230407095945
Implantable Pulse Generator Implant Date: 20190605

## 2021-04-15 NOTE — Patient Instructions (Signed)
Medication Instructions:  ?Your physician recommends that you continue on your current medications as directed. Please refer to the Current Medication list given to you today. ? ? ?*If you need a refill on your cardiac medications before your next appointment, please call your pharmacy* ? ? ?Lab Work:  [place] ? ? ?If you have labs (blood work) drawn today and your tests are completely normal, you will receive your results only by: ?MyChart Message (if you have MyChart) OR ?A paper copy in the mail ?If you have any lab test that is abnormal or we need to change your treatment, we will call you to review the results. ? ? ?Testing/Procedures: NONE ORDERED  TODAY ? ? ? ?Follow-Up: ?At [facility], you and your health needs are our priority.  As part of our continuing mission to provide you with exceptional heart care, we have created designated Provider Care Teams.  These Care Teams include your primary Cardiologist (physician) and Advanced Practice Providers (APPs -  Physician Assistants and Nurse Practitioners) who all work together to provide you with the care you need, when you need it. ? ?We recommend signing up for the patient portal called "MyChart".  Sign up information is provided on this After Visit Summary.  MyChart is used to connect with patients for Virtual Visits (Telemedicine).  Patients are able to view lab/test results, encounter notes, upcoming appointments, etc.  Non-urgent messages can be sent to your provider as well.   ?To learn more about what you can do with MyChart, go to [email or web address].   ? ?Your next appointment:   ?3 -4 month(s)  ( CONTACT [facility] FOR EP SCHEDULING ISSUES ) ? ? ?The format for your next appointment:   ?In Person ? ?Provider:   ?[name], MD  ?Other Instructions ? ?

## 2021-04-22 NOTE — Progress Notes (Signed)
Carelink Summary Report / Loop Recorder 

## 2021-04-27 DIAGNOSIS — E785 Hyperlipidemia, unspecified: Secondary | ICD-10-CM | POA: Diagnosis not present

## 2021-04-27 DIAGNOSIS — F419 Anxiety disorder, unspecified: Secondary | ICD-10-CM | POA: Diagnosis not present

## 2021-04-27 DIAGNOSIS — I700 Atherosclerosis of aorta: Secondary | ICD-10-CM | POA: Diagnosis not present

## 2021-04-27 DIAGNOSIS — E559 Vitamin D deficiency, unspecified: Secondary | ICD-10-CM | POA: Diagnosis not present

## 2021-04-27 DIAGNOSIS — I471 Supraventricular tachycardia: Secondary | ICD-10-CM | POA: Diagnosis not present

## 2021-04-27 DIAGNOSIS — R69 Illness, unspecified: Secondary | ICD-10-CM | POA: Diagnosis not present

## 2021-04-27 DIAGNOSIS — Z0001 Encounter for general adult medical examination with abnormal findings: Secondary | ICD-10-CM | POA: Diagnosis not present

## 2021-04-27 DIAGNOSIS — R32 Unspecified urinary incontinence: Secondary | ICD-10-CM | POA: Diagnosis not present

## 2021-04-27 DIAGNOSIS — G459 Transient cerebral ischemic attack, unspecified: Secondary | ICD-10-CM | POA: Diagnosis not present

## 2021-04-27 DIAGNOSIS — G479 Sleep disorder, unspecified: Secondary | ICD-10-CM | POA: Diagnosis not present

## 2021-04-27 DIAGNOSIS — N401 Enlarged prostate with lower urinary tract symptoms: Secondary | ICD-10-CM | POA: Diagnosis not present

## 2021-05-03 ENCOUNTER — Telehealth: Payer: Self-pay

## 2021-05-03 NOTE — Telephone Encounter (Signed)
LINQ alert received.  ?Device reached RRT 4/24 ?Route to triage ?[place] ? ?Unsuccessful telephone encounter to patient to discuss Linq at RRT. [employer] compliant VM message left requesting call back to [phone]. Future remotes cancelled and patient marked in active in paceart. Will discuss options for removal and confirm address for return kit upon call back. Patient will need to be discontinue in paceart.  ? ? ?

## 2021-05-04 NOTE — Telephone Encounter (Signed)
Patient called and advised ILR reached RRT. Discussed options to leave in or explant, would like to leave in at the moment and will dsicuss with MD at his next apt what he would like to do. Advised to unplug monitor. Patient will bring remote monitor in for return when he comes into the office. Advised to call if he has further questions or concerns.  ?

## 2021-05-17 DIAGNOSIS — C44729 Squamous cell carcinoma of skin of left lower limb, including hip: Secondary | ICD-10-CM | POA: Diagnosis not present

## 2021-06-19 NOTE — Progress Notes (Deleted)
Cardiology Office Note Date:  06/19/2021  Patient ID:  Jon Hughes, Jon Hughes [birth year], MRN [id number] PCP:  Josetta Huddle, MD  Electrophysiologist: Dr. Caryl Comes    Chief Complaint:    *** pt requested visit to discuss options for ILR at RRT  History of Present Illness: Jon Hughes is a 86 y.o. male with history of anxiety/depression, HLD, prostate ca, stroke > loop  He comes in today to be seen for Dr. Caryl Comes, last seen by him 2020, at that time doing well, no AFib.  12/20/20 remoted noted + AFib episode and called to come in.  I saw him 01/19/21 He feels well Mentions no cardiac awareness of any palpitations He mentions a CP that he has had "forever" that he has never really given any thought to, has a hard time describing it, maybe a "catch", it is random, rare and dates back perhaps years, maybe correleated with times of peak stress.  He is pretty vague  He reports he was visiting his brother over the holidays and New years eve had about 10 minutes of word finding difficulty "Exactly like before" He did not get evaluated or seek attention, has not happened again He denies any hx of near syncope or syncope No hematological or bleeding history  We discussed AFib, rational for a/c as well as risks, benefits.  He was undecided and wanted to d/w his PMD prior to starting A/C  I saw him 02/02/21 He feels well, no recurrent TIA-like or neurological symptoms He did have an opportunity to talk with [name] who he said was in agreement with a/c. He denies any CP, palpitations or cardiac awareness No near syncope or syncope He mentions he is a little "down", grieving his wife's death. Was started on Eliquis, ASA stopped  1/31: pt messaged that he developed diarrhea, concerned that it started after he started the eliquis, no blood reported, advised to see his PMD 1st prior to stopping the Eliquis  I saw him 04/15/21 He is doing well No bleeding or signs of bleeding No CP,  palpitations or cardiac awareness. If he has had any AFib he didn't know. No dizzy spells, near syncope or syncope. Clarifies his diarrhea is not new for him, but had a ferw days after starting Eliquis that was more then usual, and returned to his normal again. He had no concerns today Low Afib burden No changes were made, planned for labs  *** ? ILR battery now RRT *** bleeding, eliquis *** % burden   Device information MDT LINQ implant 06/13/2017 for cryptogenic stroke   Past Medical History:  Diagnosis Date   Cardiac murmur    History of tachycardia    Hx of anxiety disorder    Hx of major depression    Hyperlipidemia    Malignant neoplasm of prostate ([place]) 03/12/2014   Prostate cancer ([place])    Stroke ([place])    TIA (transient ischemic attack) 04/05/2017    Past Surgical History:  Procedure Laterality Date   HERNIA REPAIR     LOOP RECORDER INSERTION N/A 06/13/2017   Procedure: LOOP RECORDER INSERTION;  Surgeon: Deboraha Sprang, MD;  Location: [facility];  Service: Cardiovascular;  Laterality: N/A;   PROSTATE BIOPSY      Current Outpatient Medications  Medication Sig Dispense Refill   apixaban (ELIQUIS) 5 MG TABS tablet Take 1 tablet (5 mg total) by mouth 2 (two) times daily. 180 tablet 1   atorvastatin (LIPITOR) 80 MG tablet Take  1 tablet (80 mg total) by mouth daily at 6 PM. 90 tablet 3   b complex vitamins capsule Take 1 capsule by mouth daily.     metoprolol succinate (TOPROL-XL) 25 MG 24 hr tablet Take 25 mg by mouth daily.     Multiple Vitamins-Minerals (CENTRUM SILVER PO) Take by mouth. Powder from [place] Take 1 Dose by mouth daily. OPC-3 anti oxidant     PARoxetine (PAXIL) 20 MG tablet Take 2 tablets (40 mg total) by mouth daily. 180 tablet 3   tamsulosin (FLOMAX) 0.4 MG CAPS capsule Take 0.4 mg by mouth daily.      UNABLE TO FIND Med Name: CBD oil, takes 1 droper.     VITAMIN D PO Take by mouth.     No current  facility-administered medications for this visit.    Allergies:   Codeine and Sulfa antibiotics   Social History:  The patient  reports that he has quit smoking. His smoking use included cigarettes. He has a 8.00 pack-year smoking history. He has never used smokeless tobacco. He reports that he does not drink alcohol and does not use drugs.   Family History:  The patient's family history includes Breast cancer in his sister; Liver cancer in his mother; Multiple myeloma in his sister; Throat cancer in his brother.  ROS:  Please see the history of present illness.    All other systems are reviewed and otherwise negative.   PHYSICAL EXAM:  VS:  There were no vitals taken for this visit. BMI: There is no height or weight on file to calculate BMI. Well nourished, well developed, in no acute distress HEENT: normocephalic, atraumatic Neck: no JVD, carotid bruits or masses Cardiac:   *** RRR; no significant murmurs, no rubs, or gallops Lungs:  *** CTA b/l, no wheezing, rhonchi or rales Abd: soft, nontender MS: no deformity, age appropriate atrophy Ext:  ** no edema Skin: warm and dry, no rash Neuro:  No gross deficits appreciated Psych: euthymic mood, full affect  *** ILR site is stable, no tethering or discomfort   EKG:  not done today  Device interrogation done today and reviewed by myself:  ***   04/06/2017: TTE Left ventricle: The cavity size was normal. Wall thickness was    increased in a pattern of mild LVH. Systolic function was normal.    The estimated ejection fraction was in the range of 60% to 65%.    Wall motion was normal; there were no regional wall motion    abnormalities. Doppler parameters are consistent with abnormal    left ventricular relaxation (grade 1 diastolic dysfunction). The    E/e&' ratio is <8, suggesting normal LV filling pressure.  - Mitral valve: Mildly thickened leaflets . There was mild    regurgitation.  - Left atrium: The atrium was normal in  size.  - Tricuspid valve: There was mild regurgitation.  - Pulmonic valve: There was mild regurgitation.  - Pulmonary arteries: PA peak pressure: 30 mm Hg (S).  - Inferior vena cava: The vessel was normal in size. The    respirophasic diameter changes were in the normal range (>= 50%),    consistent with normal central venous pressure.   Impressions:   - LVEF 60-65%, mild LVH, normal wall motion, grade 1 DD, normal LV    filling pressure, mild MR, normal LA size, mild TR, mild PI, RVSP    30 mmHg, normal IVC.   Recent  Labs: 01/19/2021: BUN 13; Creatinine, Ser 0.99; Hemoglobin 14.6; Platelets 272; Potassium 4.3; Sodium 139  No results found for requested labs within last 365 days.   CrCl cannot be calculated (Patient's most recent lab result is older than the maximum 21 days allowed.).   Wt Readings from Last 3 Encounters:  04/15/21 158 lb (71.7 kg)  02/02/21 157 lb 3.2 oz (71.3 kg)  01/19/21 159 lb 3.2 oz (72.2 kg)     Other studies reviewed: Additional studies/records reviewed today include: summarized above  ASSESSMENT AND PLAN:  Cryptogenic stroke > loop 2.  Paroxysmal Afib CHA2DS2Vasc is 4, on *** Eliquis, appropriately dosed *** % burden ***    Disposition: ***  Current medicines are reviewed at length with the patient today.  The patient did not have any concerns regarding medicines.  Venetia Night, PA-C 06/19/2021 12:35 PM     [place] [id number] [phone] (office)  [phone] (fax)

## 2021-06-21 ENCOUNTER — Encounter: Payer: Medicare PPO | Admitting: Physician Assistant

## 2021-06-22 NOTE — Progress Notes (Signed)
Cardiology Office Note Date:  06/23/2021  Patient ID:  Jon Hughes, Jon Hughes [birth year], MRN [id number] PCP:  Josetta Huddle, MD  Electrophysiologist: Dr. Caryl Comes    Chief Complaint:     pt requested visit to discuss options for ILR at RRT  History of Present Illness: Jon Hughes is a 86 y.o. male with history of anxiety/depression, HLD, prostate ca, stroke > loop  He comes in today to be seen for Dr. Caryl Comes, last seen by him 2020, at that time doing well, no AFib.  12/20/20 remoted noted + AFib episode and called to come in.  I saw him 01/19/21 He feels well Mentions no cardiac awareness of any palpitations He mentions a CP that he has had "forever" that he has never really given any thought to, has a hard time describing it, maybe a "catch", it is random, rare and dates back perhaps years, maybe correleated with times of peak stress.  He is pretty vague  He reports he was visiting his brother over the holidays and New years eve had about 10 minutes of word finding difficulty "Exactly like before" He did not get evaluated or seek attention, has not happened again He denies any hx of near syncope or syncope No hematological or bleeding history  We discussed AFib, rational for a/c as well as risks, benefits.  He was undecided and wanted to d/w his PMD prior to starting A/C  I saw him 02/02/21 He feels well, no recurrent TIA-like or neurological symptoms He did have an opportunity to talk with [name] who he said was in agreement with a/c. He denies any CP, palpitations or cardiac awareness No near syncope or syncope He mentions he is a little "down", grieving his wife's death. Was started on Eliquis, ASA stopped  1/31: pt messaged that he developed diarrhea, concerned that it started after he started the eliquis, no blood reported, advised to see his PMD 1st prior to stopping the Eliquis  I saw him 04/15/21 He is doing well No bleeding or signs of bleeding No CP,  palpitations or cardiac awareness. If he has had any AFib he didn't know. No dizzy spells, near syncope or syncope. Clarifies his diarrhea is not new for him, but had a ferw days after starting Eliquis that was more then usual, and returned to his normal again. He had no concerns today Low Afib burden No changes were made, planned for labs  TODAY He feels well No CP, palpitations or cardiac awareness. Has not been exercising as usual his treadmill and staionary bike broke, but did get out on a wlak yesterday did a mile and felt very well. No dizziness, near syncope or syncope. He says generally he does a good job with his pills, but last night forgot his PM meds including his metoprolol. Denies any bleeding or signs of bleeding.    Device information MDT LINQ implant 06/13/2017 for cryptogenic stroke   Past Medical History:  Diagnosis Date   Cardiac murmur    History of tachycardia    Hx of anxiety disorder    Hx of major depression    Hyperlipidemia    Malignant neoplasm of prostate ([place]) 03/12/2014   Prostate cancer ([place])    Stroke ([place])    TIA (transient ischemic attack) 04/05/2017    Past Surgical History:  Procedure Laterality Date   HERNIA REPAIR     LOOP RECORDER INSERTION N/A 06/13/2017   Procedure: LOOP RECORDER INSERTION;  Surgeon: Deboraha Sprang, MD;  Location: [facility];  Service: Cardiovascular;  Laterality: N/A;   PROSTATE BIOPSY      Current Outpatient Medications  Medication Sig Dispense Refill   apixaban (ELIQUIS) 5 MG TABS tablet Take 1 tablet (5 mg total) by mouth 2 (two) times daily. 180 tablet 1   atorvastatin (LIPITOR) 80 MG tablet Take 1 tablet (80 mg total) by mouth daily at 6 PM. 90 tablet 3   b complex vitamins capsule Take 1 capsule by mouth daily.     Multiple Vitamins-Minerals (CENTRUM SILVER PO) Take by mouth. Powder from [place] Take 1 Dose by mouth daily. OPC-3 anti oxidant     PARoxetine  (PAXIL) 20 MG tablet Take 2 tablets (40 mg total) by mouth daily. 180 tablet 3   tamsulosin (FLOMAX) 0.4 MG CAPS capsule Take 0.4 mg by mouth daily.      UNABLE TO FIND Med Name: CBD oil, takes 1 droper.     VITAMIN D PO Take by mouth.     metoprolol succinate (TOPROL-XL) 25 MG 24 hr tablet Take 1 tablet (25 mg total) by mouth daily. 90 tablet 1   No current facility-administered medications for this visit.    Allergies:   Codeine, Other, and Sulfa antibiotics   Social History:  The patient  reports that he has quit smoking. His smoking use included cigarettes. He has a 8.00 pack-year smoking history. He has never used smokeless tobacco. He reports that he does not drink alcohol and does not use drugs.   Family History:  The patient's family history includes Breast cancer in his sister; Liver cancer in his mother; Multiple myeloma in his sister; Throat cancer in his brother.  ROS:  Please see the history of present illness.    All other systems are reviewed and otherwise negative.   PHYSICAL EXAM:  VS:  BP (!) 158/106   Pulse (!) 54   Ht 5' 11"  (1.803 m)   Wt 161 lb (73 kg)   SpO2 96%   BMI 22.45 kg/m  BMI: Body mass index is 22.45 kg/m. Well nourished, well developed, in no acute distress HEENT: normocephalic, atraumatic Neck: no JVD, carotid bruits or masses Cardiac:   RRR; no significant murmurs, no rubs, or gallops Lungs:  CTA b/l, no wheezing, rhonchi or rales Abd: soft, nontender MS: no deformity, age appropriate atrophy Ext:  no edema Skin: warm and dry, no rash Neuro:  No gross deficits appreciated Psych: euthymic mood, full affect  ILR site is stable, no tethering or discomfort   EKG:  not done today    04/06/2017: TTE Left ventricle: The cavity size was normal. Wall thickness was    increased in a pattern of mild LVH. Systolic function was normal.    The estimated ejection fraction was in the range of 60% to 65%.    Wall motion was normal; there were no  regional wall motion    abnormalities. Doppler parameters are consistent with abnormal    left ventricular relaxation (grade 1 diastolic dysfunction). The    E/e&' ratio is <8, suggesting normal LV filling pressure.  - Mitral valve: Mildly thickened leaflets . There was mild    regurgitation.  - Left atrium: The atrium was normal in size.  - Tricuspid valve: There was mild regurgitation.  - Pulmonic valve: There was mild regurgitation.  - Pulmonary arteries: PA peak pressure: 30 mm Hg (S).  - Inferior vena cava: The vessel was  normal in size. The    respirophasic diameter changes were in the normal range (>= 50%),    consistent with normal central venous pressure.   Impressions:   - LVEF 60-65%, mild LVH, normal wall motion, grade 1 DD, normal LV    filling pressure, mild MR, normal LA size, mild TR, mild PI, RVSP    30 mmHg, normal IVC.   Recent Labs: 01/19/2021: BUN 13; Creatinine, Ser 0.99; Hemoglobin 14.6; Platelets 272; Potassium 4.3; Sodium 139  No results found for requested labs within last 365 days.   CrCl cannot be calculated (Patient's most recent lab result is older than the maximum 21 days allowed.).   Wt Readings from Last 3 Encounters:  06/23/21 161 lb (73 kg)  04/15/21 158 lb (71.7 kg)  02/02/21 157 lb 3.2 oz (71.3 kg)     Other studies reviewed: Additional studies/records reviewed today include: summarized above  ASSESSMENT AND PLAN:  Cryptogenic stroke > loop 2.  Paroxysmal Afib CHA2DS2Vasc is 4, on Eliquis, appropriately dosed No symptoms Labs today  Loop is EOS The patient  prefers to just leave it in  3. High BP Recheck is 160/100 He did not take metoprolol last night by his d/w me, told the rooming MA that had been a couple days without meds and that his wife passed and is grieving.  He did not offer that information to me Discussed importance of his medicines He also had [place] food last night that was pretty salty. He does not want to monitor  BP s at home, he will come in next week for a BP check.    Disposition: will have him back in 9mootherwise, sooner if needed  Current medicines are reviewed at length with the patient today.  The patient did not have any concerns regarding medicines.  SVenetia Night PA-C 06/23/2021 10:21 AM     [facility] [address] [place] [phone](office)  [phone](fax)

## 2021-06-23 ENCOUNTER — Ambulatory Visit: Payer: Medicare PPO | Admitting: Physician Assistant

## 2021-06-23 ENCOUNTER — Encounter: Payer: Self-pay | Admitting: Physician Assistant

## 2021-06-23 VITALS — BP 160/100 | HR 54 | Ht 71.0 in | Wt 161.0 lb

## 2021-06-23 DIAGNOSIS — Z79899 Other long term (current) drug therapy: Secondary | ICD-10-CM | POA: Diagnosis not present

## 2021-06-23 DIAGNOSIS — I480 Paroxysmal atrial fibrillation: Secondary | ICD-10-CM | POA: Diagnosis not present

## 2021-06-23 DIAGNOSIS — I10 Essential (primary) hypertension: Secondary | ICD-10-CM | POA: Diagnosis not present

## 2021-06-23 LAB — BASIC METABOLIC PANEL
BUN/Creatinine Ratio: 14 (ref 10–24)
BUN: 14 mg/dL (ref 8–27)
CO2: 25 mmol/L (ref 20–29)
Calcium: 9.2 mg/dL (ref 8.6–10.2)
Chloride: 105 mmol/L (ref 96–106)
Creatinine, Ser: 0.99 mg/dL (ref 0.76–1.27)
Glucose: 108 mg/dL — ABNORMAL HIGH (ref 70–99)
Potassium: 4.7 mmol/L (ref 3.5–5.2)
Sodium: 140 mmol/L (ref 134–144)
eGFR: 75 mL/min/{1.73_m2} (ref >59)

## 2021-06-23 LAB — CBC
Hematocrit: 38.9 % (ref 37.5–51.0)
Hemoglobin: 13.3 g/dL (ref 13.0–17.7)
MCH: 31.4 pg (ref 26.6–33.0)
MCHC: 34.2 g/dL (ref 31.5–35.7)
MCV: 92 fL (ref 79–97)
Platelets: 224 10*3/uL (ref 150–450)
RBC: 4.24 x10E6/uL (ref 4.14–5.80)
RDW: 11.9 % (ref 11.6–15.4)
WBC: 6.3 10*3/uL (ref 3.4–10.8)

## 2021-06-23 MED ORDER — METOPROLOL SUCCINATE ER 25 MG PO TB24: 25.0000 mg | ORAL_TABLET | Freq: Every day | ORAL | 1 refills | Status: DC

## 2021-06-23 MED ORDER — APIXABAN 5 MG PO TABS: 5.0000 mg | ORAL_TABLET | Freq: Two times a day (BID) | ORAL | 1 refills | Status: AC

## 2021-06-23 NOTE — Patient Instructions (Signed)
Medication Instructions:   Your physician recommends that you continue on your current medications as directed. Please refer to the Current Medication list given to you today.  *If you need a refill on your cardiac medications before your next appointment, please call your pharmacy*   Lab Work: BMET AND CBC TODAY   If you have labs (blood work) drawn today and your tests are completely normal, you will receive your results only by: [place] (if you have MyChart) OR A paper copy in the mail If you have any lab test that is abnormal or we need to change your treatment, we will call you to review the results.   Testing/Procedures: NONE ORDERED  TODAY     Follow-Up: At [facility], you and your health needs are our priority.  As part of our continuing mission to provide you with exceptional heart care, we have created designated Provider Care Teams.  These Care Teams include your primary Cardiologist (physician) and Advanced Practice Providers (APPs -  Physician Assistants and Nurse Practitioners) who all work together to provide you with the care you need, when you need it.  We recommend signing up for the patient portal called "MyChart".  Sign up information is provided on this After Visit Summary.  MyChart is used to connect with patients for Virtual Visits (Telemedicine).  Patients are able to view lab/test results, encounter notes, upcoming appointments, etc.  Non-urgent messages can be sent to your provider as well.   To learn more about what you can do with MyChart, go to [email or web address].    Your next appointment:   6 month(s)  The format for your next appointment:   In Person  Provider:   You will see one of the following Advanced Practice Providers on your designated Care Team:   Tommye Standard, [place]       Other Instructions   Important Information About Sugar

## 2021-07-11 ENCOUNTER — Telehealth: Payer: Self-pay

## 2021-07-11 NOTE — Telephone Encounter (Signed)
Opening error 

## 2021-07-15 ENCOUNTER — Encounter: Payer: Medicare HMO | Admitting: Internal Medicine

## 2021-10-06 DIAGNOSIS — Z961 Presence of intraocular lens: Secondary | ICD-10-CM | POA: Diagnosis not present

## 2021-10-06 DIAGNOSIS — H35372 Puckering of macula, left eye: Secondary | ICD-10-CM | POA: Diagnosis not present

## 2021-10-06 DIAGNOSIS — H524 Presbyopia: Secondary | ICD-10-CM | POA: Diagnosis not present

## 2021-10-06 DIAGNOSIS — H52203 Unspecified astigmatism, bilateral: Secondary | ICD-10-CM | POA: Diagnosis not present

## 2021-10-06 DIAGNOSIS — H40013 Open angle with borderline findings, low risk, bilateral: Secondary | ICD-10-CM | POA: Diagnosis not present

## 2021-11-08 DIAGNOSIS — D485 Neoplasm of uncertain behavior of skin: Secondary | ICD-10-CM | POA: Diagnosis not present

## 2021-11-08 DIAGNOSIS — L821 Other seborrheic keratosis: Secondary | ICD-10-CM | POA: Diagnosis not present

## 2021-11-08 DIAGNOSIS — Z85828 Personal history of other malignant neoplasm of skin: Secondary | ICD-10-CM | POA: Diagnosis not present

## 2021-11-08 DIAGNOSIS — L814 Other melanin hyperpigmentation: Secondary | ICD-10-CM | POA: Diagnosis not present

## 2021-11-08 DIAGNOSIS — L720 Epidermal cyst: Secondary | ICD-10-CM | POA: Diagnosis not present

## 2021-11-08 DIAGNOSIS — L905 Scar conditions and fibrosis of skin: Secondary | ICD-10-CM | POA: Diagnosis not present

## 2021-11-08 DIAGNOSIS — L570 Actinic keratosis: Secondary | ICD-10-CM | POA: Diagnosis not present

## 2021-11-10 ENCOUNTER — Telehealth: Payer: Self-pay | Admitting: Psychiatry

## 2021-11-10 NOTE — Telephone Encounter (Signed)
Jon Hughes called today at 11:38 to request an appt. Asap.  He is facing some family issues and needs to discuss proof of sanity.  Feels it needs to be addressed asap.  I offered him an appt 11/21 where you have a opening from a cancellation and he said he needed something sooner.  He is on the CXL. Would you be willing to fit him in on a Friday?

## 2021-11-10 NOTE — Telephone Encounter (Signed)
I could see him Friday Nove 10 in the morning

## 2021-11-29 DIAGNOSIS — Z9889 Other specified postprocedural states: Secondary | ICD-10-CM | POA: Insufficient documentation

## 2021-11-29 DIAGNOSIS — I639 Cerebral infarction, unspecified: Secondary | ICD-10-CM | POA: Insufficient documentation

## 2021-11-29 DIAGNOSIS — I480 Paroxysmal atrial fibrillation: Secondary | ICD-10-CM | POA: Insufficient documentation

## 2021-11-30 ENCOUNTER — Encounter: Payer: Self-pay | Admitting: Student

## 2021-11-30 ENCOUNTER — Ambulatory Visit: Payer: Medicare PPO | Admitting: Internal Medicine

## 2021-11-30 ENCOUNTER — Ambulatory Visit: Payer: Medicare PPO | Attending: Internal Medicine | Admitting: Student

## 2021-11-30 VITALS — BP 120/58 | HR 70 | Ht 71.0 in | Wt 162.0 lb

## 2021-11-30 DIAGNOSIS — I10 Essential (primary) hypertension: Secondary | ICD-10-CM

## 2021-11-30 DIAGNOSIS — I6330 Cerebral infarction due to thrombosis of unspecified cerebral artery: Secondary | ICD-10-CM

## 2021-11-30 DIAGNOSIS — G459 Transient cerebral ischemic attack, unspecified: Secondary | ICD-10-CM | POA: Diagnosis not present

## 2021-11-30 DIAGNOSIS — I480 Paroxysmal atrial fibrillation: Secondary | ICD-10-CM | POA: Diagnosis not present

## 2021-11-30 NOTE — Progress Notes (Signed)
PCP:  Josetta Huddle, MD Primary Cardiologist: None Electrophysiologist: Jon Axe, MD   Jon Hughes is a 86 y.o. male seen today for Jon Axe, MD for routine electrophysiology followup. Since last being seen in our clinic the patient reports doing very well. Considering moving to [place]..  he denies chest pain, palpitations, dyspnea, PND, orthopnea, nausea, vomiting, dizziness, syncope, edema, weight gain, or early satiety.   Past Medical History:  Diagnosis Date   Cardiac murmur    History of tachycardia    Hx of anxiety disorder    Hx of major depression    Hyperlipidemia    Malignant neoplasm of prostate ([place]) 03/12/2014   Prostate cancer ([place])    Stroke [facility])    TIA (transient ischemic attack) 04/05/2017   Past Surgical History:  Procedure Laterality Date   HERNIA REPAIR     LOOP RECORDER INSERTION N/A 06/13/2017   Procedure: LOOP RECORDER INSERTION;  Surgeon: Deboraha Sprang, MD;  Location: [facility];  Service: Cardiovascular;  Laterality: N/A;   PROSTATE BIOPSY      Current Outpatient Medications  Medication Sig Dispense Refill   apixaban (ELIQUIS) 5 MG TABS tablet Take 1 tablet (5 mg total) by mouth 2 (two) times daily. 180 tablet 1   atorvastatin (LIPITOR) 80 MG tablet Take 1 tablet (80 mg total) by mouth daily at 6 PM. 90 tablet 3   b complex vitamins capsule Take 1 capsule by mouth daily.     metoprolol succinate (TOPROL-XL) 25 MG 24 hr tablet Take 1 tablet (25 mg total) by mouth daily. 90 tablet 1   Multiple Vitamins-Minerals (CENTRUM SILVER PO) Take by mouth. Powder from [place] Take 1 Dose by mouth daily. OPC-3 anti oxidant     PARoxetine (PAXIL) 20 MG tablet Take 2 tablets (40 mg total) by mouth daily. 180 tablet 3   tamsulosin (FLOMAX) 0.4 MG CAPS capsule Take 0.4 mg by mouth daily.      UNABLE TO FIND Med Name: CBD oil, takes 1 droper.     VITAMIN D PO Take by mouth.     No current  facility-administered medications for this visit.    Allergies  Allergen Reactions   Codeine Nausea Only   Other Other (See Comments)   Sulfa Antibiotics Rash    while in the Military    Social History   Socioeconomic History   Marital status: Divorced    Spouse name: Not on file   Number of children: 2   Years of education: Not on file   Highest education level: Not on file  Occupational History   Occupation: Marketing  Tobacco Use   Smoking status: Former    Packs/day: 1.00    Years: 8.00    Total pack years: 8.00    Types: Cigarettes   Smokeless tobacco: Never  Substance and Sexual Activity   Alcohol use: No    Alcohol/week: 0.0 standard drinks of alcohol   Drug use: No   Sexual activity: Not on file  Other Topics Concern   Not on file  Social History Narrative   Not on file   Social Determinants of Health   Financial Resource Strain: Not on file  Food Insecurity: Not on file  Transportation Needs: Not on file  Physical Activity: Not on file  Stress: Not on file  Social Connections: Not on file  Intimate Partner Violence: Not on file     Review of  Systems: All other systems reviewed and are otherwise negative except as noted above.  Physical Exam: Vitals:   11/30/21 1146  BP: (!) 120/58  Pulse: 70  SpO2: 93%  Weight: 162 lb (73.5 kg)  Height: '5\' 11"'$  (1.803 m)    GEN- The patient is well appearing, alert and oriented x 3 today.   HEENT: normocephalic, atraumatic; sclera clear, conjunctiva pink; hearing intact; oropharynx clear; neck supple, no JVP Lymph- no cervical lymphadenopathy Lungs- Clear to ausculation bilaterally, normal work of breathing.  No wheezes, rales, rhonchi Heart- Regular rate and rhythm, no murmurs, rubs or gallops, PMI not laterally displaced GI- soft, non-tender, non-distended, bowel sounds present, no hepatosplenomegaly Extremities- No peripheral edema. no clubbing or cyanosis; DP/PT/radial pulses 2+ bilaterally MS- no  significant deformity or atrophy Skin- warm and dry, no rash or lesion Psych- euthymic mood, full affect Neuro- strength and sensation are intact  EKG is not ordered. Personal review of EKG from today shows NSR at 70 bpm, with PACs  Additional studies reviewed include: Previous EP notes.   Assessment and Plan:  Cryptogenic stroke > loop, EOS 2.  Paroxysmal Afib CHA2DS2Vasc is 4, on Eliquis, appropriately dosed No symptoms EKG today NSR with PACs Labs today   Loop is EOS The patient  prefers to just leave it in   3. HTN Stable on current regimen   Follow up with Dr. Caryl Comes in 6 months  Shirley Friar, PA-C  11/30/21 11:54 AM

## 2021-11-30 NOTE — Patient Instructions (Signed)
Medication Instructions:  Your physician recommends that you continue on your current medications as directed. Please refer to the Current Medication list given to you today.  *If you need a refill on your cardiac medications before your next appointment, please call your pharmacy*   Lab Work: TODAY: BMET, CBC  If you have labs (blood work) drawn today and your tests are completely normal, you will receive your results only by: [place] (if you have MyChart) OR A paper copy in the mail If you have any lab test that is abnormal or we need to change your treatment, we will call you to review the results.   Follow-Up: At [facility], you and your health needs are our priority.  As part of our continuing mission to provide you with exceptional heart care, we have created designated Provider Care Teams.  These Care Teams include your primary Cardiologist (physician) and Advanced Practice Providers (APPs -  Physician Assistants and Nurse Practitioners) who all work together to provide you with the care you need, when you need it.  Your next appointment:   6 month(s)  The format for your next appointment:   In Person  Provider:   [name], MD    Important Information About Sugar

## 2021-12-01 LAB — BASIC METABOLIC PANEL
BUN/Creatinine Ratio: 13 (ref 10–24)
BUN: 13 mg/dL (ref 8–27)
CO2: 26 mmol/L (ref 20–29)
Calcium: 9.2 mg/dL (ref 8.6–10.2)
Chloride: 106 mmol/L (ref 96–106)
Creatinine, Ser: 0.99 mg/dL (ref 0.76–1.27)
Glucose: 105 mg/dL — ABNORMAL HIGH (ref 70–99)
Potassium: 4.5 mmol/L (ref 3.5–5.2)
Sodium: 146 mmol/L — ABNORMAL HIGH (ref 134–144)
eGFR: 75 mL/min/{1.73_m2} (ref >59)

## 2021-12-01 LAB — CBC
Hematocrit: 39.3 % (ref 37.5–51.0)
Hemoglobin: 12.9 g/dL — ABNORMAL LOW (ref 13.0–17.7)
MCH: 30.7 pg (ref 26.6–33.0)
MCHC: 32.8 g/dL (ref 31.5–35.7)
MCV: 94 fL (ref 79–97)
Platelets: 235 10*3/uL (ref 150–450)
RBC: 4.2 x10E6/uL (ref 4.14–5.80)
RDW: 11.8 % (ref 11.6–15.4)
WBC: 6.7 10*3/uL (ref 3.4–10.8)

## 2021-12-05 ENCOUNTER — Telehealth: Payer: Self-pay | Admitting: Student

## 2021-12-05 NOTE — Telephone Encounter (Signed)
Patient has questions about his after visit summary.

## 2021-12-05 NOTE — Telephone Encounter (Signed)
Pt called in asking why on his AVS it has CVA. He said he knows he had a TIA but has never been told it was a CVA. He spoke with a friend who is a retired MD and was told there is a difference and he would like an explanation.

## 2021-12-07 NOTE — Telephone Encounter (Signed)
Returned pt's call to discuss this information. I left message telling him I would call him back tomorrow.

## 2021-12-22 ENCOUNTER — Encounter: Payer: Self-pay | Admitting: Psychiatry

## 2021-12-22 ENCOUNTER — Ambulatory Visit (INDEPENDENT_AMBULATORY_CARE_PROVIDER_SITE_OTHER): Payer: Medicare PPO | Admitting: Psychiatry

## 2021-12-22 DIAGNOSIS — F4001 Agoraphobia with panic disorder: Secondary | ICD-10-CM

## 2021-12-22 DIAGNOSIS — F411 Generalized anxiety disorder: Secondary | ICD-10-CM

## 2021-12-22 DIAGNOSIS — F3342 Major depressive disorder, recurrent, in full remission: Secondary | ICD-10-CM | POA: Diagnosis not present
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
## 2021-12-23 ENCOUNTER — Telehealth: Payer: Self-pay | Admitting: Psychiatry

## 2021-12-23 NOTE — Telephone Encounter (Signed)
LVM with instructions per DPR.

## 2021-12-23 NOTE — Telephone Encounter (Signed)
LVM to RC 

## 2021-12-23 NOTE — Telephone Encounter (Signed)
LM with details and Andy's message.

## 2021-12-23 NOTE — Telephone Encounter (Signed)
As we discussed, I would recommend he reduce the paroxetine from 2 of the 40 mg tablets to 1 and 1/2 of the tablets.  Don't skip days of it otherwise he'll get dizzy and feel bad.

## 2021-12-23 NOTE — Telephone Encounter (Signed)
Patient called and said his Paxil tablets are 40 mg.

## 2021-12-23 NOTE — Telephone Encounter (Signed)
Pt lvm that the dosage of his paxil is 40 mg tablets. He said that he can cut them in half. Please let him know what dr cottle wants to do. He has not taken any today. Please give him a call at [phone]

## 2021-12-26 ENCOUNTER — Telehealth (HOSPITAL_COMMUNITY): Payer: Self-pay | Admitting: *Deleted

## 2021-12-26 NOTE — Telephone Encounter (Signed)
Patient called in stating on Sunday he was talking with a friend and for about 5 minutes his words were "all jumbled up" which he had similar symptoms with his TIA several years back. Pt is back to baseline today. Pt did not seek any form of medical care with the episode yesterday. Instructed pt to contact pcp to be evaluated today for further workup. Pt verbalized understanding. ER precautions reviewed should pt have another episode.

## 2021-12-27 ENCOUNTER — Emergency Department (HOSPITAL_BASED_OUTPATIENT_CLINIC_OR_DEPARTMENT_OTHER): Payer: Medicare PPO

## 2021-12-27 ENCOUNTER — Encounter (HOSPITAL_BASED_OUTPATIENT_CLINIC_OR_DEPARTMENT_OTHER): Payer: Self-pay | Admitting: Emergency Medicine

## 2021-12-27 ENCOUNTER — Other Ambulatory Visit: Payer: Self-pay

## 2021-12-27 ENCOUNTER — Emergency Department (HOSPITAL_BASED_OUTPATIENT_CLINIC_OR_DEPARTMENT_OTHER)
Admission: EM | Admit: 2021-12-27 | Discharge: 2021-12-27 | Disposition: A | Discharge status: Home / Self Care | Payer: Medicare PPO | Attending: Emergency Medicine | Admitting: Emergency Medicine

## 2021-12-27 DIAGNOSIS — Z7901 Long term (current) use of anticoagulants: Secondary | ICD-10-CM | POA: Diagnosis not present

## 2021-12-27 DIAGNOSIS — G459 Transient cerebral ischemic attack, unspecified: Secondary | ICD-10-CM | POA: Diagnosis not present

## 2021-12-27 DIAGNOSIS — I480 Paroxysmal atrial fibrillation: Secondary | ICD-10-CM | POA: Diagnosis not present

## 2021-12-27 DIAGNOSIS — R4701 Aphasia: Secondary | ICD-10-CM | POA: Diagnosis present

## 2021-12-27 LAB — CBC
HCT: 40.7 % (ref 39.0–52.0)
Hemoglobin: 13.4 g/dL (ref 13.0–17.0)
MCH: 32.1 pg (ref 26.0–34.0)
MCHC: 32.9 g/dL (ref 30.0–36.0)
MCV: 97.4 fL (ref 80.0–100.0)
Platelets: 217 10*3/uL (ref 150–400)
RBC: 4.18 MIL/uL — ABNORMAL LOW (ref 4.22–5.81)
RDW: 13.3 % (ref 11.5–15.5)
WBC: 6.6 10*3/uL (ref 4.0–10.5)
nRBC: 0 % (ref 0.0–0.2)

## 2021-12-27 LAB — BASIC METABOLIC PANEL
Anion gap: 6 (ref 5–15)
BUN: 16 mg/dL (ref 8–23)
CO2: 30 mmol/L (ref 22–32)
Calcium: 8.9 mg/dL (ref 8.9–10.3)
Chloride: 106 mmol/L (ref 98–111)
Creatinine, Ser: 0.96 mg/dL (ref 0.61–1.24)
GFR, Estimated: >60 mL/min (ref >60)
Glucose, Bld: 110 mg/dL — ABNORMAL HIGH (ref 70–99)
Potassium: 4.1 mmol/L (ref 3.5–5.1)
Sodium: 142 mmol/L (ref 135–145)

## 2021-12-27 NOTE — Discharge Instructions (Signed)
Your symptoms were concerning for a transient ischemic attack. You I have been on blood pressure medication, high lipid medication, and blood thinners.  These are appropriate to modify your risk factors. You have been given a referral to a neurologist.  Please call them to make appointment to see if there are any additional measures that they would advise. Also follow-up with your cardiologist and primary care doctor Return immediately if you are having any new or worsening symptoms

## 2021-12-27 NOTE — ED Provider Notes (Signed)
[facility] DEPT Provider Note   CSN: [id number] Arrival date & time: 12/27/21  9563     History  Chief Complaint  Patient presents with   Aphasia    [place] is a 86 y.o. male.  HPI 86 year old male history of paroxysmal atrial fibrillation, on Eliquis, history of TIA presented as aphasia presents today stating that he had an episode of aphasia 3 to 4 days ago that lasted 5 minutes.  Describes as not being able to speak clearly for 5 minutes and completely resolved.  He had no visual changes, no lateralized weakness, no headache, no head trauma.     Home Medications Prior to Admission medications   Medication Sig Start Date End Date Taking? Authorizing Provider  apixaban (ELIQUIS) 5 MG TABS tablet Take 1 tablet (5 mg total) by mouth 2 (two) times daily. 06/23/21   Baldwin Jamaica, PA-C  atorvastatin (LIPITOR) 80 MG tablet Take 1 tablet (80 mg total) by mouth daily at 6 PM. 05/10/17   Frann Rider, NP  b complex vitamins capsule Take 1 capsule by mouth daily.    [provider]  metoprolol succinate (TOPROL-XL) 25 MG 24 hr tablet Take 1 tablet (25 mg total) by mouth daily. 06/23/21   Baldwin Jamaica, PA-C  Multiple Vitamins-Minerals (CENTRUM SILVER PO) Take by mouth. Powder from [employer], Historical, MD  OVER THE COUNTER MEDICATION Take 1 Dose by mouth daily. OPC-3 anti oxidant    [provider]  PARoxetine (PAXIL) 20 MG tablet Take 2 tablets (40 mg total) by mouth daily. Patient taking differently: Take 40 mg by mouth daily. 40 mg tab 03/10/21   Cottle, Billey Co., MD  tamsulosin (FLOMAX) 0.4 MG CAPS capsule Take 0.4 mg by mouth daily.     [provider]  UNABLE TO FIND Med Name: CBD oil, takes 1 droper.    [provider]  VITAMIN D PO Take by mouth.    [provider]      Allergies    Codeine, Other, and Sulfa antibiotics    Review of Systems   Review of  Systems  Physical Exam Updated Vital Signs BP (!) 149/75   Pulse (!) 50   Temp 97.7 F (36.5 C) (Oral)   Resp 18   Ht 1.778 m ('5\' 10"'$ )   Wt 72.6 kg   SpO2 99%   BMI 22.96 kg/m  Physical Exam Vitals and nursing note reviewed.  Constitutional:      Appearance: He is well-developed.  HENT:     Head: Normocephalic and atraumatic.     Right Ear: Tympanic membrane and external ear normal.     Left Ear: Tympanic membrane and external ear normal.     Nose: Nose normal.     Right Sinus: No maxillary sinus tenderness or frontal sinus tenderness.     Left Sinus: No maxillary sinus tenderness or frontal sinus tenderness.  Eyes:     Extraocular Movements:     Right eye: No nystagmus.     Left eye: No nystagmus.     Conjunctiva/sclera: Conjunctivae normal.     Pupils: Pupils are equal, round, and reactive to light.  Cardiovascular:     Rate and Rhythm: Normal rate and regular rhythm.     Heart sounds: Normal heart sounds.  Pulmonary:     Effort: Pulmonary effort is normal. No respiratory distress.     Breath sounds: Normal breath sounds.  Chest:  Chest wall: No tenderness.  Abdominal:     General: Bowel sounds are normal. There is no distension.     Palpations: Abdomen is soft. There is no mass.     Tenderness: There is no abdominal tenderness.  Musculoskeletal:        General: No tenderness. Normal range of motion.     Cervical back: Normal range of motion and neck supple.  Skin:    General: Skin is warm and dry.     Capillary Refill: Capillary refill takes less than 2 seconds.     Findings: No rash.  Neurological:     Mental Status: He is alert and oriented to person, place, and time.     GCS: GCS eye subscore is 4. GCS verbal subscore is 5. GCS motor subscore is 6.     Cranial Nerves: No cranial nerve deficit.     Sensory: No sensory deficit.     Motor: No weakness.     Coordination: Coordination normal.     Gait: Gait normal.     Deep Tendon Reflexes: Reflexes are  normal and symmetric. Reflexes normal. Babinski sign absent on the right side. Babinski sign absent on the left side.     Reflex Scores:      Tricep reflexes are 2+ on the right side and 2+ on the left side.      Bicep reflexes are 2+ on the right side and 2+ on the left side.      Brachioradialis reflexes are 2+ on the right side and 2+ on the left side.      Patellar reflexes are 2+ on the right side and 2+ on the left side.      Achilles reflexes are 2+ on the right side and 2+ on the left side.    Comments: Patient with normal gait without ataxia, shuffling, spasm, or antalgia. Speech is normal without dysarthria, dysphasia, or aphasia. Muscle strength is 5/5 in bilateral shoulders, elbow flexor and extensors, wrist flexor and extensors, and intrinsic hand muscles. 5/5 bilateral lower extremity hip flexors, extensors, knee flexors and extensors, and ankle dorsi and plantar flexors.    Psychiatric:        Behavior: Behavior normal.        Thought Content: Thought content normal.        Judgment: Judgment normal.     ED Results / Procedures / Treatments   Labs (all labs ordered are listed, but only abnormal results are displayed) Labs Reviewed  CBC - Abnormal; Notable for the following components:      Result Value   RBC 4.18 (*)    All other components within normal limits  BASIC METABOLIC PANEL - Abnormal; Notable for the following components:   Glucose, Bld 110 (*)    All other components within normal limits    EKG EKG Interpretation  Date/Time:  Tuesday December 27 2021 10:13:36 EST Ventricular Rate:  53 PR Interval:  152 QRS Duration: 97 QT Interval:  475 QTC Calculation: 446 R Axis:   70 Text Interpretation: Sinus rhythm Normal ECG since last tracing, pvcs have resolved Confirmed by Pattricia Boss [phone]) on 12/27/2021 10:19:06 AM  Radiology CT Head Wo Contrast  Result Date: 12/27/2021 CLINICAL DATA:  Transient ischemic attack. 5 minute episode of aphasia on  11/16. EXAM: CT HEAD WITHOUT CONTRAST TECHNIQUE: Contiguous axial images were obtained from the base of the skull through the vertex without intravenous contrast. RADIATION DOSE REDUCTION: This exam was performed according to the  departmental dose-optimization program which includes automated exposure control, adjustment of the mA and/or kV according to patient size and/or use of iterative reconstruction technique. COMPARISON:  MR head dated April 06, 2017. FINDINGS: Brain: No evidence of acute infarction, hemorrhage, hydrocephalus, extra-axial collection or mass lesion/mass effect. Mild cerebral atrophy and chronic microvascular ischemic changes of the white matter, unchanged. Vascular: No hyperdense vessel or unexpected calcification. Skull: Normal. Negative for fracture or focal lesion. Sinuses/Orbits: No acute finding. Other: None. IMPRESSION: 1. No acute intracranial abnormality. 2. Mild cerebral atrophy and chronic microvascular ischemic changes of the white matter, unchanged. Electronically Signed   By: Keane Police D.O.   On: 12/27/2021 09:51    Procedures .Critical Care  Performed by: Pattricia Boss, MD Authorized by: Pattricia Boss, MD   Critical care provider statement:    Critical care time (minutes):  30   Critical care end time:  12/27/2021 11:18 AM   Critical care was necessary to treat or prevent imminent or life-threatening deterioration of the following conditions:  CNS failure or compromise   Critical care was time spent personally by me on the following activities:  Development of treatment plan with patient or surrogate, discussions with consultants, evaluation of patient's response to treatment, examination of patient, ordering and review of laboratory studies, ordering and review of radiographic studies, ordering and performing treatments and interventions, pulse oximetry, re-evaluation of patient's condition and review of old charts     Medications Ordered in ED Medications - No  data to display  ED Course/ Medical Decision Making/ A&P Clinical Course as of 12/27/21 1119  Tue Dec 27, 2021  1036 CBC reviewed interpreted and within normal limits [DR]  1036 CT head reviewed interpreted no evidence of abnormality noted on my interpretation and radiologist interpretation notes no acute intracranial abnormality mild cerebral atrophy and chronic microvascular ischemic changes of white matter unchanged from prior [DR]  1101 CBC reviewed interpreted within normal limits [DR]  6579 Basic metabolic panel reviewed interpreted significant for mild hyperglycemia with glucose of 110 otherwise within normal limits [DR]    Clinical Course User Index [DR] Pattricia Boss, MD                           Medical Decision Making 86 year old male history of aphasia several days ago.  He has prior history of TIA.  He had workup in 2018 at that time carotid arteries had a 1 to 39% stenosis bilaterally, vertebrals showed antegrade flow he was started on a statin Plavix and aspirin.  Since that time he has been found to have A-fib and has been on Eliquis.  He reports 1 missed dose about a week ago but otherwise has been taking as prescribed although he reports taking it late 1 or 2 times Differential diagnosis includes but is not limited to stroke, TIA, metastatic disease, intracranial hemorrhage, metabolic abnormalities Patient has had previous testing including his carotid artery ultrasound although he may benefit from retesting at some point. Do not feel that MRI is needed at this time as symptoms lasted for 5 minutes several days ago and have completely resolved.  There is no evidence of cancer malady noted on head CT Patient is in a normal sinus rhythm but does have known paroxysmal atrial fibrillation.  He is on Eliquis.  We will continue his anticoagulation. Currently patient appears to have his risk factors modification maximized. Plan referral to outpatient neurology Patient advised to  follow-up with his primary  care provider Patient given return precautions and need for follow-up Discussed  Amount and/or Complexity of Data Reviewed External Data Reviewed: labs, radiology, ECG and notes.    Details: Previous discharge summary from 2019 reviewed and noted MRI findings, vascular ultrasound findings Prior EKG compared to today's EKG Labs: ordered. Decision-making details documented in ED Course. Radiology: ordered and independent interpretation performed. ECG/medicine tests: ordered and independent interpretation performed.           Final Clinical Impression(s) / ED Diagnoses Final diagnoses:  TIA (transient ischemic attack)    Rx / DC Orders ED Discharge Orders     None         Pattricia Boss, MD 12/27/21 1119

## 2021-12-27 NOTE — ED Triage Notes (Signed)
Pt arrives to ED with c/o a 5 minute episode of aphasia on 11/16. Hx of a TIA.

## 2022-01-16 ENCOUNTER — Telehealth: Payer: Self-pay | Admitting: Psychiatry

## 2022-01-16 NOTE — Telephone Encounter (Signed)
No.  Thank you.  He and talked about it at his visit.  No further action needed.

## 2022-01-16 NOTE — Telephone Encounter (Signed)
Pt LVM on 1/7@ 7:18a.  He wanted to advise Dr Clovis Pu that he has reduced his Paxil to '40mg'$ .  Next appt 3/4

## 2022-01-16 NOTE — Telephone Encounter (Signed)
FYI.Do you want me to call and find out why?

## 2022-01-17 DIAGNOSIS — M25511 Pain in right shoulder: Secondary | ICD-10-CM | POA: Diagnosis not present

## 2022-01-24 ENCOUNTER — Ambulatory Visit: Payer: Medicare PPO | Admitting: Psychiatry

## 2022-01-24 NOTE — Progress Notes (Signed)
Double booked.

## 2022-02-03 DIAGNOSIS — L905 Scar conditions and fibrosis of skin: Secondary | ICD-10-CM | POA: Diagnosis not present

## 2022-02-03 DIAGNOSIS — Z85828 Personal history of other malignant neoplasm of skin: Secondary | ICD-10-CM | POA: Diagnosis not present

## 2022-02-03 DIAGNOSIS — L570 Actinic keratosis: Secondary | ICD-10-CM | POA: Diagnosis not present

## 2022-02-27 ENCOUNTER — Inpatient Hospital Stay: Payer: Medicare PPO | Admitting: Diagnostic Neuroimaging

## 2022-03-13 ENCOUNTER — Ambulatory Visit: Payer: Medicare PPO | Admitting: Psychiatry

## 2022-03-13 ENCOUNTER — Encounter: Payer: Self-pay | Admitting: Psychiatry

## 2022-03-13 DIAGNOSIS — F411 Generalized anxiety disorder: Secondary | ICD-10-CM | POA: Diagnosis not present

## 2022-03-13 DIAGNOSIS — F3342 Major depressive disorder, recurrent, in full remission: Secondary | ICD-10-CM

## 2022-03-13 DIAGNOSIS — F4001 Agoraphobia with panic disorder: Secondary | ICD-10-CM | POA: Diagnosis not present
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
## 2022-03-14 ENCOUNTER — Encounter: Payer: Self-pay | Admitting: Diagnostic Neuroimaging

## 2022-03-14 ENCOUNTER — Ambulatory Visit: Payer: Medicare PPO | Admitting: Diagnostic Neuroimaging

## 2022-03-14 VITALS — BP 139/82 | HR 58 | Ht 70.0 in | Wt 161.0 lb

## 2022-03-14 DIAGNOSIS — G459 Transient cerebral ischemic attack, unspecified: Secondary | ICD-10-CM | POA: Diagnosis not present

## 2022-03-14 NOTE — Patient Instructions (Signed)
TIA (transient aphasia, 5 minutes, Dec 2023) - continue eliquis, atorvastatin, metoprolol - check carotid u/s

## 2022-03-14 NOTE — Progress Notes (Signed)
[facility]  PATIENT: Jon Hughes DOB: 08/24/1935  REFERRING CLINICIAN: Pattricia Boss, MD HISTORY FROM: patient REASON FOR VISIT: new consult   HISTORICAL  CHIEF COMPLAINT:  Chief Complaint  Patient presents with   New Patient (Initial Visit)    Patient in room #6 and alone. Pt here for a f/u for his stroke.    HISTORY OF PRESENT ILLNESS:   UPDATE (03/14/22, VRP): Since last visit, now dx'd with afib in Dec 2022, and now on eliquis. In Dec 2023 had another event of 5 minutes aphasia, went to ER. May have missed a dose of eliquis. Otherwise doing well. Planning to move to retirement community in [place] in a few weeks.   PRIOR HPI (05/10/17, JM): "Jon Hughes is being seen today for initial visit in the office for TIA on 04/05/17. History obtained from husband, wife, and chart review. Reviewed all radiology images and labs personally.  [name] is a 87 year old male with history of depression on Paxil, anxiety, tachycardia, prostate cancer admitted for one episode of aphasia, and dyslexia.  Symptoms lasted about 10-15 minutes and the back to baseline.  CTA head concerning for left cerebellar infarct however MRI brain reviewed and showed no acute findings, or cerebellar infarct.  CTA head and neck unremarkable.  EF 60-65%.  Carotid Doppler unremarkable.  LDL 111 and A1c 5.7. Patient stroke concerning for cortical TIA.  Patient does have a history of tachycardia in the past, as per patient, he had heart monitoring in [place] many years ago with 2 weeks of monitoring, but no abnormality found.  Given the history of tachycardia, it was recommended by Dr. Erlinda Hong for patient to repeat 30-day Cardiac event monitor as outpatient to rule out A. fib.  If that was negative, possibly consider loop recorder for long-term cardiac monitoring. Patient denies history of migraine, however he does have intermittent headaches. He did have a similar headache at the time of aphasia  and dyslexia.  Therefore, there is low suspicion of complicated migraine. Patient neuro examination was normal during admission. Patient was not previously on antithrombotic PTA and it was recommend dual antiplatelet with aspirin and Plavix for 3 weeks and then aspirin alone. Start Lipitor 80 for stroke prevention and high LDL. Patient discharged home in stable condition without therapy needs.              Since discharge, patient has been doing well. Continues to take lipitor without side effects of myalgias. Continues to take aspirin with side effects of mild bruising but not bleeding. Used plavix for a total of [redacted] weeks along with aspirin and stopped taking plavix due to running out. Advised patient to take aspirin only at this time. Blood pressure satisfactory at 109/62. Patient eats vegan diet and continues to stay active. Patient recently increased Paxil from '10mg'$  to '20mg'$  due to increased agitation. This was patients previous dose but self decreased from '20mg'$  to '10mg'$ . He does have current prescription for Paxil '20mg'$ . He feels as though this increase has helped decrease agitation. Recommended by Dr. Erlinda Hong during admission to repeat 30 day cardiac monitor. Patient will be following up with PCP today and wants to speak with PCP regarding this first. We will request copy of office visit from [name] at [facility] after his appointment. Denies new or worsening stroke/TIA symptoms."  REVIEW OF SYSTEMS: Full 14 system review of systems performed and negative with exception of: as per HPI.  ALLERGIES: Allergies  Allergen Reactions  Codeine Nausea Only   Other Other (See Comments)   Sulfa Antibiotics Rash    while in the [place]: Outpatient Medications Prior to Visit  Medication Sig Dispense Refill   apixaban (ELIQUIS) 5 MG TABS tablet Take 1 tablet (5 mg total) by mouth 2 (two) times daily. 180 tablet 1   atorvastatin (LIPITOR) 80 MG tablet Take 1 tablet (80 mg total) by  mouth daily at 6 PM. (Patient taking differently: Take 20 mg by mouth daily at 6 PM.) 90 tablet 3   b complex vitamins capsule Take 1 capsule by mouth daily.     metoprolol succinate (TOPROL-XL) 25 MG 24 hr tablet Take 1 tablet (25 mg total) by mouth daily. 90 tablet 1   Multiple Vitamins-Minerals (CENTRUM SILVER PO) Take by mouth. Powder from [place] Take 1 Dose by mouth daily. OPC-3 anti oxidant     PARoxetine (PAXIL) 20 MG tablet Take 2 tablets (40 mg total) by mouth daily. (Patient taking differently: Take 40 mg by mouth daily. 40 mg tab) 180 tablet 3   tamsulosin (FLOMAX) 0.4 MG CAPS capsule Take 0.4 mg by mouth daily.      UNABLE TO FIND Med Name: CBD oil, takes 1 droper.     VITAMIN D PO Take by mouth.     No facility-administered medications prior to visit.    PAST MEDICAL HISTORY: Past Medical History:  Diagnosis Date   Cardiac murmur    History of tachycardia    Hx of anxiety disorder    Hx of major depression    Hyperlipidemia    Malignant neoplasm of prostate ([place]) 03/12/2014   Prostate cancer ([place])    Stroke ([place])    TIA (transient ischemic attack) 04/05/2017    PAST SURGICAL HISTORY: Past Surgical History:  Procedure Laterality Date   HERNIA REPAIR     LOOP RECORDER INSERTION N/A 06/13/2017   Procedure: LOOP RECORDER INSERTION;  Surgeon: Deboraha Sprang, MD;  Location: [facility];  Service: Cardiovascular;  Laterality: N/A;   PROSTATE BIOPSY      FAMILY HISTORY: Family History  Problem Relation Age of Onset   Liver cancer Mother    Breast cancer Sister    Multiple myeloma Sister    Throat cancer Brother     SOCIAL HISTORY: Social History   Socioeconomic History   Marital status: Divorced    Spouse name: Not on file   Number of children: 2   Years of education: Not on file   Highest education level: Not on file  Occupational History   Occupation: Marketing  Tobacco Use   Smoking status: Former     Packs/day: 1.00    Years: 8.00    Total pack years: 8.00    Types: Cigarettes   Smokeless tobacco: Never  Substance and Sexual Activity   Alcohol use: No    Alcohol/week: 0.0 standard drinks of alcohol   Drug use: No   Sexual activity: Not on file  Other Topics Concern   Not on file  Social History Narrative   Not on file   Social Determinants of Health   Financial Resource Strain: Not on file  Food Insecurity: Not on file  Transportation Needs: Not on file  Physical Activity: Not on file  Stress: Not on file  Social Connections: Not on file  Intimate Partner Violence: Not on file     PHYSICAL EXAM  GENERAL EXAM/CONSTITUTIONAL:  Vitals:  Vitals:   03/14/22 0913  BP: 139/82  Pulse: (!) 58  Weight: 161 lb (73 kg)  Height: '5\' 10"'$  (1.778 m)   Body mass index is 23.1 kg/m. Wt Readings from Last 3 Encounters:  03/14/22 161 lb (73 kg)  12/27/21 160 lb (72.6 kg)  11/30/21 162 lb (73.5 kg)   Patient is in no distress; well developed, nourished and groomed; neck is supple  CARDIOVASCULAR: Examination of carotid arteries is normal; no carotid bruits Regular rate and rhythm, no murmurs Examination of peripheral vascular system by observation and palpation is normal  EYES: Ophthalmoscopic exam of optic discs and posterior segments is normal; no papilledema or hemorrhages No results found.  MUSCULOSKELETAL: Gait, strength, tone, movements noted in Neurologic exam below  NEUROLOGIC: MENTAL STATUS:      No data to display         awake, alert, oriented to person, place and time recent and remote memory intact normal attention and concentration language fluent, comprehension intact, naming intact fund of knowledge appropriate  CRANIAL NERVE:  2nd - no papilledema on fundoscopic exam 2nd, 3rd, 4th, 6th - pupils equal and reactive to light, visual fields full to confrontation, extraocular muscles intact, no nystagmus 5th - facial sensation symmetric 7th -  facial strength symmetric 8th - hearing intact 9th - palate elevates symmetrically, uvula midline 11th - shoulder shrug symmetric 12th - tongue protrusion midline  MOTOR:  normal bulk and tone, full strength in the BUE, BLE  SENSORY:  normal and symmetric to light touch, temperature, vibration  COORDINATION:  finger-nose-finger, fine finger movements normal  REFLEXES:  deep tendon reflexes TRACE and symmetric  GAIT/STATION:  narrow based gait     DIAGNOSTIC DATA (LABS, IMAGING, TESTING) - I reviewed patient records, labs, notes, testing and imaging myself where available.  Lab Results  Component Value Date   WBC 6.6 12/27/2021   HGB 13.4 12/27/2021   HCT 40.7 12/27/2021   MCV 97.4 12/27/2021   PLT 217 12/27/2021      Component Value Date/Time   NA 142 12/27/2021 1020   NA 146 (H) 11/30/2021 1201   K 4.1 12/27/2021 1020   CL 106 12/27/2021 1020   CO2 30 12/27/2021 1020   GLUCOSE 110 (H) 12/27/2021 1020   BUN 16 12/27/2021 1020   BUN 13 11/30/2021 1201   CREATININE 0.96 12/27/2021 1020   CALCIUM 8.9 12/27/2021 1020   PROT 6.7 04/05/2017 1615   ALBUMIN 3.8 04/05/2017 1615   AST 21 04/05/2017 1615   ALT 16 (L) 04/05/2017 1615   ALKPHOS 78 04/05/2017 1615   BILITOT 0.7 04/05/2017 1615   GFRNONAA >60 12/27/2021 1020   GFRAA >60 04/06/2017 0225   Lab Results  Component Value Date   CHOL 168 04/06/2017   HDL 36 (L) 04/06/2017   LDLCALC 111 (H) 04/06/2017   TRIG 107 04/06/2017   CHOLHDL 4.7 04/06/2017   Lab Results  Component Value Date   HGBA1C 5.7 (H) 04/06/2017   No results found for: "VITAMINB12" Lab Results  Component Value Date   TSH 1.400 04/06/2017   04/06/17  carotid u/s Right Carotid: Velocities in the right ICA are consistent with a 1-39%  stenosis.   Left Carotid: Velocities in the left ICA are consistent with a 1-39%  stenosis.   Vertebrals: Bilateral vertebral arteries demonstrate antegrade flow.  Subclavians: Normal flow  hemodynamics were seen in bilateral subclavian  arteries.   04/06/17 CTA head / neck 1. Patent carotid and vertebral arteries. No dissection, aneurysm, or hemodynamically significant stenosis utilizing NASCET criteria. 2. Patent anterior and posterior intracranial circulation. No large vessel occlusion, aneurysm, or significant stenosis. 3. Moderate atherosclerosis of the aorta and carotid bifurcations. 4. Moderate cervical spondylosis with multilevel bony foraminal stenosis. No high-grade bony canal stenosis.  04/06/17 MRI brain 1. No acute intracranial abnormality identified. 2. Mild for age chronic microvascular ischemic changes and parenchymal volume loss of the brain. 3. Mild paranasal sinus disease.  12/27/21 CT head 1. No acute intracranial abnormality. 2. Mild cerebral atrophy and chronic microvascular ischemic changes of the white matter, unchanged.   ASSESSMENT AND PLAN  87 y.o. year old male here with:   Dx:  1. TIA (transient ischemic attack)     PLAN:  TIA (transient aphasia, 5 minutes, Dec 2023) - continue eliquis, atorvastatin, metoprolol - check carotid u/s  Orders Placed This Encounter  Procedures   VAS US CAROTID   Return for pending if symptoms worsen or fail to improve, pending test results.    Penni Bombard, MD [id number], [id number] AM Certified in Neurology, Neurophysiology and Neuroimaging  [facility] [address] [place], [place] [address] [phone]

## 2022-03-29 ENCOUNTER — Other Ambulatory Visit: Payer: Self-pay | Admitting: Physician Assistant

## 2022-04-06 DIAGNOSIS — S0990XA Unspecified injury of head, initial encounter: Secondary | ICD-10-CM | POA: Diagnosis not present

## 2022-04-06 DIAGNOSIS — Y92002 Bathroom of unspecified non-institutional (private) residence single-family (private) house as the place of occurrence of the external cause: Secondary | ICD-10-CM | POA: Diagnosis not present

## 2022-04-06 DIAGNOSIS — Z23 Encounter for immunization: Secondary | ICD-10-CM | POA: Diagnosis not present

## 2022-04-06 DIAGNOSIS — M25551 Pain in right hip: Secondary | ICD-10-CM | POA: Diagnosis not present

## 2022-04-06 DIAGNOSIS — Z7901 Long term (current) use of anticoagulants: Secondary | ICD-10-CM | POA: Diagnosis not present

## 2022-04-06 DIAGNOSIS — S61411A Laceration without foreign body of right hand, initial encounter: Secondary | ICD-10-CM | POA: Diagnosis not present

## 2022-04-06 DIAGNOSIS — Z87891 Personal history of nicotine dependence: Secondary | ICD-10-CM | POA: Diagnosis not present

## 2022-04-06 DIAGNOSIS — W01198A Fall on same level from slipping, tripping and stumbling with subsequent striking against other object, initial encounter: Secondary | ICD-10-CM | POA: Diagnosis not present

## 2022-04-06 DIAGNOSIS — M79631 Pain in right forearm: Secondary | ICD-10-CM | POA: Diagnosis not present

## 2022-04-06 DIAGNOSIS — S069X0A Unspecified intracranial injury without loss of consciousness, initial encounter: Secondary | ICD-10-CM | POA: Diagnosis not present

## 2022-04-06 DIAGNOSIS — I480 Paroxysmal atrial fibrillation: Secondary | ICD-10-CM | POA: Diagnosis not present

## 2022-04-06 DIAGNOSIS — S51811A Laceration without foreign body of right forearm, initial encounter: Secondary | ICD-10-CM | POA: Diagnosis not present

## 2022-04-07 DIAGNOSIS — Z7901 Long term (current) use of anticoagulants: Secondary | ICD-10-CM | POA: Diagnosis not present

## 2022-04-07 DIAGNOSIS — Z4800 Encounter for change or removal of nonsurgical wound dressing: Secondary | ICD-10-CM | POA: Diagnosis not present

## 2022-04-07 DIAGNOSIS — M8588 Other specified disorders of bone density and structure, other site: Secondary | ICD-10-CM | POA: Diagnosis not present

## 2022-04-07 DIAGNOSIS — J302 Other seasonal allergic rhinitis: Secondary | ICD-10-CM | POA: Diagnosis not present

## 2022-04-07 DIAGNOSIS — K439 Ventral hernia without obstruction or gangrene: Secondary | ICD-10-CM | POA: Diagnosis not present

## 2022-04-07 DIAGNOSIS — I7090 Unspecified atherosclerosis: Secondary | ICD-10-CM | POA: Diagnosis not present

## 2022-04-07 DIAGNOSIS — Z4801 Encounter for change or removal of surgical wound dressing: Secondary | ICD-10-CM | POA: Diagnosis not present

## 2022-04-07 DIAGNOSIS — N529 Male erectile dysfunction, unspecified: Secondary | ICD-10-CM | POA: Diagnosis not present

## 2022-04-07 DIAGNOSIS — F329 Major depressive disorder, single episode, unspecified: Secondary | ICD-10-CM | POA: Diagnosis not present

## 2022-04-07 DIAGNOSIS — L7622 Postprocedural hemorrhage and hematoma of skin and subcutaneous tissue following other procedure: Secondary | ICD-10-CM | POA: Diagnosis not present

## 2022-04-07 DIAGNOSIS — F411 Generalized anxiety disorder: Secondary | ICD-10-CM | POA: Diagnosis not present

## 2022-04-07 DIAGNOSIS — M1990 Unspecified osteoarthritis, unspecified site: Secondary | ICD-10-CM | POA: Diagnosis not present

## 2022-04-11 ENCOUNTER — Ambulatory Visit (HOSPITAL_COMMUNITY)
Admission: RE | Admit: 2022-04-11 | Discharge: 2022-04-11 | Disposition: A | Discharge status: Home / Self Care | Payer: Medicare PPO | Source: Ambulatory Visit | Attending: Diagnostic Neuroimaging | Admitting: Diagnostic Neuroimaging

## 2022-04-11 DIAGNOSIS — G459 Transient cerebral ischemic attack, unspecified: Secondary | ICD-10-CM

## 2022-04-11 NOTE — Progress Notes (Signed)
Bilateral carotid ultrasound study completed.   Please see CV Procedures for preliminary results.  Curry Seefeldt, RVT  9:50 AM 04/11/22

## 2022-04-18 DIAGNOSIS — F411 Generalized anxiety disorder: Secondary | ICD-10-CM | POA: Diagnosis not present

## 2022-04-18 DIAGNOSIS — Z4802 Encounter for removal of sutures: Secondary | ICD-10-CM | POA: Diagnosis not present

## 2022-04-18 DIAGNOSIS — W01198D Fall on same level from slipping, tripping and stumbling with subsequent striking against other object, subsequent encounter: Secondary | ICD-10-CM | POA: Diagnosis not present

## 2022-04-18 DIAGNOSIS — S51811D Laceration without foreign body of right forearm, subsequent encounter: Secondary | ICD-10-CM | POA: Diagnosis not present

## 2022-05-10 DIAGNOSIS — L814 Other melanin hyperpigmentation: Secondary | ICD-10-CM | POA: Diagnosis not present

## 2022-05-10 DIAGNOSIS — L820 Inflamed seborrheic keratosis: Secondary | ICD-10-CM | POA: Diagnosis not present

## 2022-05-10 DIAGNOSIS — L570 Actinic keratosis: Secondary | ICD-10-CM | POA: Diagnosis not present

## 2022-05-10 DIAGNOSIS — D1801 Hemangioma of skin and subcutaneous tissue: Secondary | ICD-10-CM | POA: Diagnosis not present

## 2022-05-10 DIAGNOSIS — Z85828 Personal history of other malignant neoplasm of skin: Secondary | ICD-10-CM | POA: Diagnosis not present

## 2022-12-04 NOTE — Progress Notes (Unsigned)
Cardiology Office Note Date:  12/04/2022  Patient ID:  Jon, Hughes 28-Feb-1935, MRN [id number] PCP:  Marden Noble, MD (Inactive) >> Dr. Jamey Reas ([place]) Electrophysiologist: Dr. Graciela Husbands    Chief Complaint:      overdue 6 mo  History of Present Illness: Jon Hughes is a 87 y.o. male with history of anxiety/depression, HLD, prostate ca, stroke/TIA > loop  He comes in today to be seen for Dr. Graciela Husbands, last seen by him 2020, at that time doing well, no AFib.  12/20/20 remoted noted + AFib episode and called to come in.  I saw him 01/19/21 He feels well Mentions no cardiac awareness of any palpitations He mentions a CP that he has had "forever" that he has never really given any thought to, has a hard time describing it, maybe a "catch", it is random, rare and dates back perhaps years, maybe correleated with times of peak stress.  He is pretty vague  He reports he was visiting his brother over the holidays and New years eve had about 10 minutes of word finding difficulty "Exactly like before" He did not get evaluated or seek attention, has not happened again He denies any hx of near syncope or syncope No hematological or bleeding history  We discussed AFib, rational for a/c as well as risks, benefits.  He was undecided and wanted to d/w his PMD prior to starting A/C  I saw him 02/02/21 He feels well, no recurrent TIA-like or neurological symptoms He did have an opportunity to talk with Dr. Kevan Ny who he said was in agreement with a/c. He denies any CP, palpitations or cardiac awareness No near syncope or syncope He mentions he is a little "down", grieving his wife's death. Was started on Eliquis, ASA stopped  1/31: pt messaged that he developed diarrhea, concerned that it started after he started the eliquis, no blood reported, advised to see his PMD 1st prior to stopping the Eliquis  I saw him 04/15/21 He is doing well No bleeding or signs of bleeding No CP, palpitations or  cardiac awareness. If he has had any AFib he didn't know. No dizzy spells, near syncope or syncope. Clarifies his diarrhea is not new for him, but had a ferw days after starting Eliquis that was more then usual, and returned to his normal again. He had no concerns today Low Afib burden No changes were made, planned for labs  06/23/21 He feels well No CP, palpitations or cardiac awareness. Has not been exercising as usual his treadmill and staionary bike broke, but did get out on a wlak yesterday did a mile and felt very well. No dizziness, near syncope or syncope. He says generally he does a good job with his pills, but last night forgot his PM meds including his metoprolol. Denies any bleeding or signs of bleeding.  He saw A. Tillery, PA-C 12/05/21, doing well, no changes were made, rec to see Dr. Graciela Husbands in 27mo  TODAY He feels great! 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   Device information MDT LINQ implant 06/13/2017 for cryptogenic stroke Known EOS, pt preferred to leave the device in   Past Medical History:  Diagnosis Date   Cardiac murmur  History of tachycardia    Hx of anxiety disorder    Hx of major depression    Hyperlipidemia    Malignant neoplasm of prostate (HCC) 03/12/2014   Prostate cancer (HCC)    Stroke [facility])    TIA (transient ischemic attack) 04/05/2017    Past Surgical History:  Procedure Laterality Date   HERNIA REPAIR     LOOP RECORDER INSERTION N/A 06/13/2017   Procedure: LOOP RECORDER INSERTION;  Surgeon: Duke Salvia, MD;  Location: [facility] INVASIVE CV LAB;  Service: Cardiovascular;  Laterality: N/A;   PROSTATE BIOPSY      Current Outpatient Medications  Medication Sig  Dispense Refill   apixaban (ELIQUIS) 5 MG TABS tablet Take 1 tablet (5 mg total) by mouth 2 (two) times daily. 180 tablet 1   atorvastatin (LIPITOR) 80 MG tablet Take 1 tablet (80 mg total) by mouth daily at 6 PM. (Patient taking differently: Take 20 mg by mouth daily at 6 PM.) 90 tablet 3   b complex vitamins capsule Take 1 capsule by mouth daily.     metoprolol succinate (TOPROL-XL) 25 MG 24 hr tablet TAKE ONE TABLET BY MOUTH ONE TIME DAILY 90 tablet 2   Multiple Vitamins-Minerals (CENTRUM SILVER PO) Take by mouth. Powder from [employer]     OVER THE COUNTER MEDICATION Take 1 Dose by mouth daily. OPC-3 anti oxidant     PARoxetine (PAXIL) 20 MG tablet Take 2 tablets (40 mg total) by mouth daily. (Patient taking differently: Take 40 mg by mouth daily. 40 mg tab) 180 tablet 3   tamsulosin (FLOMAX) 0.4 MG CAPS capsule Take 0.4 mg by mouth daily.      UNABLE TO FIND Med Name: CBD oil, takes 1 droper.     VITAMIN D PO Take by mouth.     No current facility-administered medications for this visit.    Allergies:   Codeine, Other, and Sulfa antibiotics   Social History:  The patient  reports that he has quit smoking. His smoking use included cigarettes. He has a 8 pack-year smoking history. He has never used smokeless tobacco. He reports that he does not drink alcohol and does not use drugs.   Family History:  The patient's family history includes Breast cancer in his sister; Liver cancer in his mother; Multiple myeloma in his sister; Throat cancer in his brother.  ROS:  Please see the history of present illness.    All other systems are reviewed and otherwise negative.   PHYSICAL EXAM:  VS:  There were no vitals taken for this visit. BMI: There is no height or weight on file to calculate BMI. Well nourished, well developed, in no acute distress HEENT: normocephalic, atraumatic Neck: no JVD, carotid bruits or masses Cardiac:   RRR; no significant murmurs, no rubs, or gallops Lungs: CTA  b/l, no wheezing, rhonchi or rales Abd: soft, nontender MS: no deformity, age appropriate atrophy Ext: no edema Skin: warm and dry, no rash Neuro:  No gross deficits appreciated Psych: euthymic mood, full affect  ILR site is stable, no tethering or discomfort   EKG:  done today and reviewed by myself SR, 72bpm, PACs    04/06/2017: TTE Left ventricle: The cavity size was normal. Wall thickness was    increased in a pattern of mild LVH. Systolic function was normal.    The estimated ejection fraction was in the range of 60% to 65%.    Wall motion was normal; there were no regional wall motion  abnormalities. Doppler parameters are consistent with abnormal    left ventricular relaxation (grade 1 diastolic dysfunction). The    E/e&' ratio is <8, suggesting normal LV filling pressure.  - Mitral valve: Mildly thickened leaflets . There was mild    regurgitation.  - Left atrium: The atrium was normal in size.  - Tricuspid valve: There was mild regurgitation.  - Pulmonic valve: There was mild regurgitation.  - Pulmonary arteries: PA peak pressure: 30 mm Hg (S).  - Inferior vena cava: The vessel was normal in size. The    respirophasic diameter changes were in the normal range (>= 50%),    consistent with normal central venous pressure.   Impressions:   - LVEF 60-65%, mild LVH, normal wall motion, grade 1 DD, normal LV    filling pressure, mild MR, normal LA size, mild TR, mild PI, RVSP    30 mmHg, normal IVC.   Recent Labs: 12/27/2021: BUN 16; Creatinine, Ser 0.96; Hemoglobin 13.4; Platelets 217; Potassium 4.1; Sodium 142  No results found for requested labs within last 365 days.   CrCl cannot be calculated (Patient's most recent lab result is older than the maximum 21 days allowed.).   Wt Readings from Last 3 Encounters:  03/14/22 161 lb (73 kg)  12/27/21 160 lb (72.6 kg)  11/30/21 162 lb (73.5 kg)     Other studies reviewed: Additional studies/records reviewed today  include: summarized above  ASSESSMENT AND PLAN:  Cryptogenic stroke > loop 2.  Paroxysmal Afib CHA2DS2Vasc is 4, on Eliquis, appropriately dosed for weight/creat No symptoms   He reports labs done recently at the [place]   3. High BP Recheck is 128/68  4. Secondary hypercoagulable state     Disposition: we will have him back in 23mo again, sooner if needed  Current medicines are reviewed at length with the patient today.  The patient did not have any concerns regarding medicines.  Norma Fredrickson, PA-C 12/04/2022 7:22 AM     [facility] [address] [place] [address] [phone] (office)  [phone] (fax)

## 2022-12-06 ENCOUNTER — Ambulatory Visit: Payer: Medicare PPO | Attending: Physician Assistant | Admitting: Physician Assistant

## 2022-12-06 ENCOUNTER — Encounter: Payer: Self-pay | Admitting: Physician Assistant

## 2022-12-06 VITALS — BP 140/78 | HR 72 | Ht 70.0 in | Wt 164.0 lb

## 2022-12-06 DIAGNOSIS — D6869 Other thrombophilia: Secondary | ICD-10-CM | POA: Diagnosis not present

## 2022-12-06 DIAGNOSIS — I480 Paroxysmal atrial fibrillation: Secondary | ICD-10-CM | POA: Diagnosis not present

## 2022-12-06 NOTE — Patient Instructions (Signed)
Medication Instructions:  Your physician recommends that you continue on your current medications as directed. Please refer to the Current Medication list given to you today.  *If you need a refill on your cardiac medications before your next appointment, please call your pharmacy*   Lab Work: None.  If you have labs (blood work) drawn today and your tests are completely normal, you will receive your results only by: MyChart Message (if you have MyChart) OR A paper copy in the mail If you have any lab test that is abnormal or we need to change your treatment, we will call you to review the results.   Testing/Procedures: None.   Follow-Up: At [facility], you and your health needs are our priority.  As part of our continuing mission to provide you with exceptional heart care, we have created designated Provider Care Teams.  These Care Teams include your primary Cardiologist (physician) and Advanced Practice Providers (APPs -  Physician Assistants and Nurse Practitioners) who all work together to provide you with the care you need, when you need it.  We recommend signing up for the patient portal called "MyChart".  Sign up information is provided on this After Visit Summary.  MyChart is used to connect with patients for Virtual Visits (Telemedicine).  Patients are able to view lab/test results, encounter notes, upcoming appointments, etc.  Non-urgent messages can be sent to your provider as well.   To learn more about what you can do with MyChart, go to [email or web address].    Your next appointment:   1 year(s)  Provider:   Francis Dowse, PA-C or Dr. Sherryl Manges

## 2023-01-19 ENCOUNTER — Telehealth: Payer: Self-pay | Admitting: Internal Medicine

## 2023-01-19 NOTE — Telephone Encounter (Signed)
 Spoke with pt who reports PCP following his BP but he has noted readings as below.  Pt reports he takes Metoprolol  at night.  Pt denies CP, SOB, edema, headache or dizziness.  Encouraged pt to check his BP daily for the next 5 days at the same time each day and send readings or notify PCP with readings.  Pt advised he should be well hydrated and monitor salt intake.  Pt verbalizes understanding and agrees with current plan.

## 2023-01-19 NOTE — Telephone Encounter (Signed)
 Pt c/o BP issue: STAT if pt c/o blurred vision, one-sided weakness or slurred speech  1. What are your last 5 BP readings?  132/80  152/80   2. Are you having any other symptoms (ex. Dizziness, headache, blurred vision, passed out)?  No--denies dizziness, headaches, palpitations  3. What is your BP issue?   Patient states his BP has been fluctuating.

## 2023-03-13 ENCOUNTER — Other Ambulatory Visit: Payer: Self-pay | Admitting: Psychiatry

## 2023-03-13 ENCOUNTER — Telehealth: Payer: Self-pay | Admitting: Psychiatry

## 2023-03-13 ENCOUNTER — Ambulatory Visit (INDEPENDENT_AMBULATORY_CARE_PROVIDER_SITE_OTHER): Payer: Medicare PPO | Admitting: Psychiatry

## 2023-03-13 DIAGNOSIS — Z0289 Encounter for other administrative examinations: Secondary | ICD-10-CM

## 2023-03-13 NOTE — Telephone Encounter (Signed)
 Pt called at 1:08p because he was running late for his appt today at 1.  He was not able to see Dr Jennelle Human, but wanted to relay the message that he has increased his Paroxetine from 1 pill a day to 1.5 pills a day about 6 months ago and last week he increased it to 2 pills a day.  He said he feels better today than he has since his wife died 3 years ago.  He feels like "my mind is working right again."  Next appt 4/21

## 2023-03-13 NOTE — Telephone Encounter (Signed)
 Please see note from patient. Per last visit 03/2022 he was supposed to be taking 40 mg.  Continue paroxetine 40 mg daily.

## 2023-03-13 NOTE — Telephone Encounter (Signed)
 Patient informed of recommendation

## 2023-03-13 NOTE — Telephone Encounter (Signed)
 Good.  No change indicated .  Continue paroxetine 20 mg tablets, 2 daily.

## 2023-03-28 ENCOUNTER — Ambulatory Visit: Admitting: Psychiatry

## 2023-04-06 ENCOUNTER — Telehealth: Payer: Self-pay | Admitting: Psychiatry

## 2023-04-06 DIAGNOSIS — F3342 Major depressive disorder, recurrent, in full remission: Secondary | ICD-10-CM

## 2023-04-06 DIAGNOSIS — F4001 Agoraphobia with panic disorder: Secondary | ICD-10-CM

## 2023-04-06 DIAGNOSIS — F411 Generalized anxiety disorder: Secondary | ICD-10-CM

## 2023-04-06 MED ORDER — PAROXETINE HCL 40 MG PO TABS: 40.0000 mg | ORAL_TABLET | ORAL | 0 refills | Status: DC

## 2023-04-06 NOTE — Telephone Encounter (Signed)
 Pended 40 mg tablet for 30 days to [facility]. Has FU with Dr. Jennelle Human next month.

## 2023-04-06 NOTE — Telephone Encounter (Signed)
 Pt needs a refill on his paxil 40 mg. He would like to have it sent the [facility] in [place] on [address]. The phone number is [phone]. He either wants 40 mg sent in and take 1 pill a day or 2 of the 20 mg . He would like a 3 month supply.

## 2023-04-18 ENCOUNTER — Ambulatory Visit: Admitting: Psychiatry

## 2023-04-18 NOTE — Progress Notes (Signed)
 No show

## 2023-04-30 ENCOUNTER — Ambulatory Visit: Admitting: Psychiatry

## 2023-04-30 NOTE — Progress Notes (Signed)
 No show

## 2023-05-04 ENCOUNTER — Telehealth: Payer: Self-pay | Admitting: Psychiatry

## 2023-05-04 MED ORDER — PAROXETINE HCL 40 MG PO TABS: 40.0000 mg | ORAL_TABLET | ORAL | 0 refills | Status: DC

## 2023-05-04 NOTE — Telephone Encounter (Signed)
 Pt has not been seen since 3/24; has had 2 no shows and 2 cancel/no shows. Cannot send 90-day supply.

## 2023-05-04 NOTE — Telephone Encounter (Signed)
 Sent 30-day supply of 40 mg Paxil  to [facility] in [place].

## 2023-05-04 NOTE — Telephone Encounter (Signed)
 Pt called asking for a refill on her paxil  40 mg. He would like to have a 90 day supply. Pharmacy is  [facility] in [place]. Phone number is [phone]

## 2023-05-15 DIAGNOSIS — L812 Freckles: Secondary | ICD-10-CM | POA: Diagnosis not present

## 2023-05-15 DIAGNOSIS — L821 Other seborrheic keratosis: Secondary | ICD-10-CM | POA: Diagnosis not present

## 2023-05-15 DIAGNOSIS — D1801 Hemangioma of skin and subcutaneous tissue: Secondary | ICD-10-CM | POA: Diagnosis not present

## 2023-05-15 DIAGNOSIS — D485 Neoplasm of uncertain behavior of skin: Secondary | ICD-10-CM | POA: Diagnosis not present

## 2023-05-15 DIAGNOSIS — Z85828 Personal history of other malignant neoplasm of skin: Secondary | ICD-10-CM | POA: Diagnosis not present

## 2023-05-15 DIAGNOSIS — L814 Other melanin hyperpigmentation: Secondary | ICD-10-CM | POA: Diagnosis not present

## 2023-05-15 DIAGNOSIS — L570 Actinic keratosis: Secondary | ICD-10-CM | POA: Diagnosis not present

## 2023-05-28 ENCOUNTER — Encounter: Payer: Self-pay | Admitting: Psychiatry

## 2023-05-28 ENCOUNTER — Ambulatory Visit (INDEPENDENT_AMBULATORY_CARE_PROVIDER_SITE_OTHER): Admitting: Psychiatry

## 2023-05-28 DIAGNOSIS — F4001 Agoraphobia with panic disorder: Secondary | ICD-10-CM | POA: Diagnosis not present

## 2023-05-28 DIAGNOSIS — F3342 Major depressive disorder, recurrent, in full remission: Secondary | ICD-10-CM | POA: Diagnosis not present

## 2023-05-28 DIAGNOSIS — F411 Generalized anxiety disorder: Secondary | ICD-10-CM

## 2023-05-28 MED ORDER — PAROXETINE HCL 40 MG PO TABS: 40.0000 mg | ORAL_TABLET | ORAL | 2 refills | Status: AC
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
## 2023-06-08 DIAGNOSIS — C61 Malignant neoplasm of prostate: Secondary | ICD-10-CM | POA: Diagnosis not present

## 2023-06-08 DIAGNOSIS — R1084 Generalized abdominal pain: Secondary | ICD-10-CM | POA: Diagnosis not present

## 2023-06-08 DIAGNOSIS — R351 Nocturia: Secondary | ICD-10-CM | POA: Diagnosis not present

## 2023-06-08 DIAGNOSIS — Z8546 Personal history of malignant neoplasm of prostate: Secondary | ICD-10-CM | POA: Diagnosis not present

## 2023-06-29 DIAGNOSIS — F17211 Nicotine dependence, cigarettes, in remission: Secondary | ICD-10-CM | POA: Diagnosis not present

## 2023-06-29 DIAGNOSIS — Z139 Encounter for screening, unspecified: Secondary | ICD-10-CM | POA: Diagnosis not present

## 2023-06-29 DIAGNOSIS — N400 Enlarged prostate without lower urinary tract symptoms: Secondary | ICD-10-CM | POA: Diagnosis not present

## 2023-06-29 DIAGNOSIS — I4891 Unspecified atrial fibrillation: Secondary | ICD-10-CM | POA: Diagnosis not present

## 2023-06-29 DIAGNOSIS — Z79899 Other long term (current) drug therapy: Secondary | ICD-10-CM | POA: Diagnosis not present

## 2023-06-29 DIAGNOSIS — J449 Chronic obstructive pulmonary disease, unspecified: Secondary | ICD-10-CM | POA: Diagnosis not present

## 2023-06-29 DIAGNOSIS — E785 Hyperlipidemia, unspecified: Secondary | ICD-10-CM | POA: Diagnosis not present

## 2023-06-29 DIAGNOSIS — M545 Low back pain, unspecified: Secondary | ICD-10-CM | POA: Diagnosis not present

## 2023-06-29 DIAGNOSIS — I110 Hypertensive heart disease with heart failure: Secondary | ICD-10-CM | POA: Diagnosis not present

## 2023-07-04 ENCOUNTER — Ambulatory Visit: Admitting: Psychiatry

## 2023-09-26 ENCOUNTER — Telehealth: Payer: Self-pay | Admitting: Physician Assistant

## 2023-09-26 NOTE — Telephone Encounter (Signed)
 Routed to Pheba PA for any recommendations/referrals

## 2023-09-26 NOTE — Telephone Encounter (Signed)
 Patient moved to [facility] and would like to be referred to a cardiologist in the [place] area.

## 2023-09-28 ENCOUNTER — Telehealth: Payer: Self-pay | Admitting: Psychiatry

## 2023-09-28 NOTE — Telephone Encounter (Signed)
 Pt need rf of Paxil     Next appt 05/24/24   [facility] [address]  [place]

## 2023-09-28 NOTE — Telephone Encounter (Signed)
 Spoke with patient and stated that he has an Dance movement psychotherapist from [place] and will use that outlet to find a cardiologist close by. But patients still wanted to keep appt with Renee in Dec just in case.

## 2023-09-28 NOTE — Telephone Encounter (Signed)
 Pt has RF available, notified him.

## 2023-12-17 NOTE — Progress Notes (Unsigned)
 Cardiology Office Note Date:  12/17/2023  Patient ID:  Jon Hughes 1935/12/16, MRN [id number] PCP:  Jon Hughes Lamar, MD (Inactive) >> Dr. Onetha ([facility]) Electrophysiologist: Dr. Fernande    Chief Complaint:      *** annual visit  History of Present Illness: Jon Hughes is a 88 y.o. male with history of anxiety/depression, HLD, prostate ca, stroke/TIA > loop  He comes in today to be seen for Dr. Fernande, last seen by him 2020, at that time doing well, no AFib.  12/20/20 remoted noted + AFib episode and called to come in.  I saw him 01/19/21 He feels well Mentions no cardiac awareness of any palpitations He mentions a CP that he has had forever that he has never really given any thought to, has a hard time describing it, maybe a catch, it is random, rare and dates back perhaps years, maybe correleated with times of peak stress.  He is pretty vague  He reports he was visiting his brother over the holidays and New years eve had about 10 minutes of word finding difficulty Exactly like before He did not get evaluated or seek attention, has not happened again He denies any hx of near syncope or syncope No hematological or bleeding history  We discussed AFib, rational for a/c as well as risks, benefits.  He was undecided and wanted to d/w his PMD prior to starting A/C  I saw him 02/02/21 He feels well, no recurrent TIA-like or neurological symptoms He did have an opportunity to talk with Dr. Delice who he said was in agreement with a/c. He denies any CP, palpitations or cardiac awareness No near syncope or syncope He mentions he is a little down, grieving his wife's death. Was started on Eliquis , ASA stopped  1/31: pt messaged that he developed diarrhea, concerned that it started after he started the eliquis , no blood reported, advised to see his PMD 1st prior to stopping the Eliquis   I saw him 04/15/21 He is doing well No bleeding or signs of bleeding No CP,  palpitations or cardiac awareness. If he has had any AFib he didn't know. No dizzy spells, near syncope or syncope. Clarifies his diarrhea is not new for him, but had a ferw days after starting Eliquis  that was more then usual, and returned to his normal again. He had no concerns today Low Afib burden No changes were made, planned for labs  06/23/21 He feels well No CP, palpitations or cardiac awareness. Has not been exercising as usual his treadmill and staionary bike broke, but did get out on a wlak yesterday did a mile and felt very well. No dizziness, near syncope or syncope. He says generally he does a good job with his pills, but last night forgot his PM meds including his metoprolol . Denies any bleeding or signs of bleeding.  He saw A. Tillery, PA-C 12/05/21, doing well, no changes were made, rec to see Dr. Fernande in 87mo  I saw him Nov 2024 He feels great! 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 >>> moved to [facility] *** symptoms, AFib burden *** labs, ?[facility]  Device information MDT LINQ implant 06/13/2017 for cryptogenic stroke Known  EOS, pt preferred to leave the device in   Past Medical History:  Diagnosis Date   Cardiac murmur    History of tachycardia    Hx of anxiety disorder    Hx of major depression    Hyperlipidemia    Malignant neoplasm of prostate (HCC) 03/12/2014   Prostate cancer (HCC)    Stroke [facility])    TIA (transient ischemic attack) 04/05/2017    Past Surgical History:  Procedure Laterality Date   HERNIA REPAIR     LOOP RECORDER INSERTION N/A 06/13/2017   Procedure: LOOP RECORDER INSERTION;  Surgeon: Jon Hughes Jon BROCKS, MD;  Location: [facility] INVASIVE CV  LAB;  Service: Cardiovascular;  Laterality: N/A;   PROSTATE BIOPSY      Current Outpatient Medications  Medication Sig Dispense Refill   apixaban  (ELIQUIS ) 5 MG TABS tablet Take 1 tablet (5 mg total) by mouth 2 (two) times daily. 180 tablet 1   atorvastatin  (LIPITOR) 80 MG tablet Take 1 tablet (80 mg total) by mouth daily at 6 PM. (Patient not taking: Reported on 12/06/2022) 90 tablet 3   b complex vitamins capsule Take 1 capsule by mouth daily.     metoprolol  succinate (TOPROL -XL) 25 MG 24 hr tablet TAKE ONE TABLET BY MOUTH ONE TIME DAILY 90 tablet 2   Multiple Vitamins-Minerals (CENTRUM SILVER PO) Take by mouth. Powder from [employer]     OVER THE COUNTER MEDICATION Take 1 Dose by mouth daily. OPC-3 anti oxidant     PARoxetine  (PAXIL ) 40 MG tablet Take 1 tablet (40 mg total) by mouth every morning. 90 tablet 2   tamsulosin (FLOMAX) 0.4 MG CAPS capsule Take 0.4 mg by mouth daily.      UNABLE TO FIND Med Name: CBD oil, takes 1 droper.     VITAMIN D PO Take by mouth.     No current facility-administered medications for this visit.    Allergies:   Codeine, Other, and Sulfa antibiotics   Social History:  The patient  reports that he has quit smoking. His smoking use included cigarettes. He has a 8 pack-year smoking history. He has never used smokeless tobacco. He reports that he does not drink alcohol and does not use drugs.   Family History:  The patient's family history includes Breast cancer in his sister; Liver cancer in his mother; Multiple myeloma in his sister; Throat cancer in his brother.  ROS:  Please see the history of present illness.    All other systems are reviewed and otherwise negative.   PHYSICAL EXAM:  VS:  There were no vitals taken for this visit. BMI: There is no height or weight on file to calculate BMI. Well nourished, well developed, in no acute distress HEENT: normocephalic, atraumatic Neck: no JVD, carotid bruits or masses Cardiac:  *** RRR; no  significant murmurs, no rubs, or gallops Lungs: *** CTA b/l, no wheezing, rhonchi or rales Abd: soft, nontender MS: no deformity, age appropriate atrophy Ext: *** no edema Skin: warm and dry, no rash Neuro:  No gross deficits appreciated Psych: euthymic mood, full affect  *** ILR site is stable, no tethering or discomfort   EKG:  done today and reviewed by myself ***    04/06/2017: TTE Left ventricle: The cavity size was normal. Wall thickness was    increased in a pattern of mild LVH. Systolic function was normal.    The estimated ejection fraction was in the range of 60% to 65%.    Wall motion was normal;  there were no regional wall motion    abnormalities. Doppler parameters are consistent with abnormal    left ventricular relaxation (grade 1 diastolic dysfunction). The    E/e&' ratio is <8, suggesting normal LV filling pressure.  - Mitral valve: Mildly thickened leaflets . There was mild    regurgitation.  - Left atrium: The atrium was normal in size.  - Tricuspid valve: There was mild regurgitation.  - Pulmonic valve: There was mild regurgitation.  - Pulmonary arteries: PA peak pressure: 30 mm Hg (S).  - Inferior vena cava: The vessel was normal in size. The    respirophasic diameter changes were in the normal range (>= 50%),    consistent with normal central venous pressure.   Impressions:   - LVEF 60-65%, mild LVH, normal wall motion, grade 1 DD, normal LV    filling pressure, mild MR, normal LA size, mild TR, mild PI, RVSP    30 mmHg, normal IVC.   Recent Labs: No results found for requested labs within last 365 days.  No results found for requested labs within last 365 days.   CrCl cannot be calculated (Patient's most recent lab result is older than the maximum 21 days allowed.).   Wt Readings from Last 3 Encounters:  12/06/22 164 lb (74.4 kg)  03/14/22 161 lb (73 kg)  12/27/21 160 lb (72.6 kg)     Other studies reviewed: Additional studies/records  reviewed today include: summarized above  ASSESSMENT AND PLAN:  Cryptogenic stroke > loop 2.  Paroxysmal Afib CHA2DS2Vasc is 4, on Eliquis , *** appropriately dosed for weight/creat *** No symptoms   *** He reports labs done recently at the [place]   3. High BP ***  4. Secondary hypercoagulable state     Disposition: *** sooner if needed  Current medicines are reviewed at length with the patient today.  The patient did not have any concerns regarding medicines.  Bonney Charlies Arthur, PA-C 12/17/2023 9:07 AM     [facility] [address] [place] [address] [phone] (office)  [phone] (fax)

## 2023-12-18 ENCOUNTER — Ambulatory Visit: Attending: Physician Assistant | Admitting: Physician Assistant

## 2023-12-18 VITALS — BP 142/74 | HR 59 | Ht 70.0 in | Wt 165.0 lb

## 2023-12-18 DIAGNOSIS — D6869 Other thrombophilia: Secondary | ICD-10-CM

## 2023-12-18 DIAGNOSIS — I480 Paroxysmal atrial fibrillation: Secondary | ICD-10-CM

## 2023-12-18 DIAGNOSIS — I10 Essential (primary) hypertension: Secondary | ICD-10-CM

## 2023-12-18 NOTE — Patient Instructions (Signed)
 Medication Instructions:   Your physician recommends that you continue on your current medications as directed. Please refer to the Current Medication list given to you today.  *If you need a refill on your cardiac medications before your next appointment, please call your pharmacy*  Lab Work: NONE ORDERED  TODAY    If you have labs (blood work) drawn today and your tests are completely normal, you will receive your results only by: MyChart Message (if you have MyChart) OR A paper copy in the mail If you have any lab test that is abnormal or we need to change your treatment, we will call you to review the results.  Testing/Procedures: NONE ORDERED  TODAY   Follow-Up: At [facility], you and your health needs are our priority.  As part of our continuing mission to provide you with exceptional heart care, our providers are all part of one team.  This team includes your primary Cardiologist (physician) and Advanced Practice Providers or APPs (Physician Assistants and Nurse Practitioners) who all work together to provide you with the care you need, when you need it.  Your next appointment: CONTACT [facility] [phone] AS NEEDED FOR  ANY CARDIAC RELATED SYMPTOMS   We recommend signing up for the patient portal called MyChart.  Sign up information is provided on this After Visit Summary.  MyChart is used to connect with patients for Virtual Visits (Telemedicine).  Patients are able to view lab/test results, encounter notes, upcoming appointments, etc.  Non-urgent messages can be sent to your provider as well.   To learn more about what you can do with MyChart, go to [email or web address].   Other Instructions

## 2023-12-31 ENCOUNTER — Telehealth: Payer: Self-pay | Admitting: Psychiatry

## 2023-12-31 NOTE — Telephone Encounter (Signed)
 Please see note from patient.  Past psychiatric meds. He has been under my psychiatric care since 1997 for major depression and generalized anxiety disorder with panic. Most of that time is been on paroxetine  with good response. His last significant relapse was in 2014. He failed to respond to citalopram at that time. Did respond to brief potentiation with Abilify on Paxil  30 mg. Since then he has been maintained on Paxil  20 mg effectively.   Epic notes only go back to 2020.

## 2023-12-31 NOTE — Telephone Encounter (Signed)
 Pt called asking for med CC gave him around 2021 when he was having a hard time with depression that snapped him out of it. He stated. Son in hospital with severe depression and would like to offer info to his doctor.Please return call. Pt very concerned about his son. [phone]

## 2024-01-07 NOTE — Telephone Encounter (Signed)
 Pt lvm wanting to knowwhat depression medication Dr. Geoffry put him so he can  relay info to son who is in [facility] currently. States it was the last thing he was put on but can't remember the name.

## 2024-01-07 NOTE — Telephone Encounter (Signed)
 Called patient and let him know that from notes Abilify was the medication.

## 2024-02-25 ENCOUNTER — Ambulatory Visit: Admitting: Psychiatry
